# Patient Record
Sex: Male | Born: 1970 | Hispanic: No | Marital: Single | Smoking: Former smoker
Health system: Southern US, Community
[De-identification: ages and names within clinical notes are randomized; demographics above are authoritative.]

## PROBLEM LIST (undated history)

## (undated) DIAGNOSIS — E785 Hyperlipidemia, unspecified: Secondary | ICD-10-CM

## (undated) DIAGNOSIS — K449 Diaphragmatic hernia without obstruction or gangrene: Secondary | ICD-10-CM

## (undated) DIAGNOSIS — K224 Dyskinesia of esophagus: Secondary | ICD-10-CM

## (undated) DIAGNOSIS — J45909 Unspecified asthma, uncomplicated: Secondary | ICD-10-CM

## (undated) HISTORY — DX: Unspecified asthma, uncomplicated: J45.909

## (undated) HISTORY — PX: HEMORRHOID SURGERY: SHX153

## (undated) HISTORY — PX: HERNIA REPAIR: SHX51

---

## 2010-04-03 ENCOUNTER — Encounter: Payer: Self-pay | Admitting: Nurse Practitioner

## 2010-10-07 ENCOUNTER — Encounter: Payer: Self-pay | Admitting: Nurse Practitioner

## 2010-10-08 ENCOUNTER — Telehealth (INDEPENDENT_AMBULATORY_CARE_PROVIDER_SITE_OTHER): Payer: Self-pay

## 2010-10-10 ENCOUNTER — Ambulatory Visit: Payer: Self-pay | Admitting: Internal Medicine

## 2010-10-10 DIAGNOSIS — K648 Other hemorrhoids: Secondary | ICD-10-CM | POA: Insufficient documentation

## 2010-10-10 DIAGNOSIS — K625 Hemorrhage of anus and rectum: Secondary | ICD-10-CM

## 2010-10-17 ENCOUNTER — Encounter: Admission: RE | Admit: 2010-10-17 | Discharge: 2010-10-17 | Payer: Self-pay | Admitting: Internal Medicine

## 2010-11-04 ENCOUNTER — Ambulatory Visit: Payer: Self-pay | Admitting: Internal Medicine

## 2011-01-14 NOTE — Letter (Signed)
Summary: [facility] Instructions  [facility]  [address] [place], [place] [address]   Phone: [phone]  Fax: [phone]       Gavin Henry    01/18/1971    MRN: [id number]        Procedure Day /Date: 11-04-10     Arrival Time: 3:00 PM      Procedure Time :4:00 PM     Location of Procedure:                    X     [facility] (4th Floor)                         PREPARATION FOR COLONOSCOPY WITH MOVIPREP   Starting 5 days prior to your procedure 10-30-10 do not eat nuts, seeds, popcorn, corn, beans, peas,  salads, or any raw vegetables.  Do not take any fiber supplements (e.g. Metamucil, Citrucel, and Benefiber).  THE DAY BEFORE YOUR PROCEDURE         DATE: 11-03-10  DAY: Sunday  1.  Drink clear liquids the entire day-NO SOLID FOOD  2.  Do not drink anything colored red or purple.  Avoid juices with pulp.  No orange juice.  3.  Drink at least 64 oz. (8 glasses) of fluid/clear liquids during the day to prevent dehydration and help the prep work efficiently.  CLEAR LIQUIDS INCLUDE: Water Jello Ice Popsicles Tea (sugar ok, no milk/cream) Powdered fruit flavored drinks Coffee (sugar ok, no milk/cream) Gatorade Juice: apple, white grape, white cranberry  Lemonade Clear bullion, consomm, broth Carbonated beverages (any kind) Strained chicken noodle soup Hard Candy                             4.  In the morning, mix first dose of MoviPrep solution:    Empty 1 Pouch A and 1 Pouch B into the disposable container    Add lukewarm drinking water to the top line of the container. Mix to dissolve    Refrigerate (mixed solution should be used within 24 hrs)  5.  Begin drinking the prep at 5:00 p.m. The MoviPrep container is divided by 4 marks.   Every 15 minutes drink the solution down to the next mark (approximately 8 oz) until the full liter is complete.   6.  Follow completed prep with 16 oz of clear liquid of your choice (Nothing red  or purple).  Continue to drink clear liquids until bedtime.  7.  Before going to bed, mix second dose of MoviPrep solution:    Empty 1 Pouch A and 1 Pouch B into the disposable container    Add lukewarm drinking water to the top line of the container. Mix to dissolve    Refrigerate  THE DAY OF YOUR PROCEDURE      DATE: 11-04-10 DAY: Monday  Beginning at 11:00 AM  (5 hours before procedure):         1. Every 15 minutes, drink the solution down to the next mark (approx 8 oz) until the full liter is complete.  2. Follow completed prep with 16 oz. of clear liquid of your choice.    3. You may drink clear liquids until 2:00 PM  (2 HOURS BEFORE PROCEDURE).   MEDICATION INSTRUCTIONS  Unless otherwise instructed, you should take regular prescription medications with a small sip of water   as early  as possible the morning of your procedure.       OTHER INSTRUCTIONS  You will need a responsible adult at least 39 years of age to accompany you and drive you home.   This person must remain in the waiting room during your procedure.  Wear loose fitting clothing that is easily removed.  Leave jewelry and other valuables at home.  However, you may wish to bring a book to read or  an iPod/MP3 player to listen to music as you wait for your procedure to start.  Remove all body piercing jewelry and leave at home.  Total time from sign-in until discharge is approximately 2-3 hours.  You should go home directly after your procedure and rest.  You can resume normal activities the  day after your procedure.  The day of your procedure you should not:   Drive   Make legal decisions   Operate machinery   Drink alcohol   Return to work  You will receive specific instructions about eating, activities and medications before you leave.    The above instructions have been reviewed and explained to me by   _______________________    I fully understand and can verbalize these  instructions _____________________________ Date _________

## 2011-01-14 NOTE — Progress Notes (Signed)
Summary: triage  Phone Note From Other Clinic Call back at [phone]   Caller: Darl Pikes, scheduler Call For: Dr. Russella Dar  (doctor of the day) Reason for Call: Schedule Patient Appt Summary of Call: Dr. Perrin Maltese would like pt seen asap for "large volume rectal bleeding" Initial call taken by: Vallarie Mare,  October 08, 2010 3:39 PM  Follow-up for Phone Call        On and off rectal bleeding.  "large volume".  Patient  was seen in their office yesterday.   Patient  will come in and see Willette Cluster RNP on 10/10/10 2:00 Follow-up by: Darcey Nora RN, CGRN,  October 08, 2010 3:51 PM

## 2011-01-14 NOTE — Assessment & Plan Note (Signed)
Summary: rectal bleeding/Gavin Henry   History of Present Illness Visit Type: Initial Consult Primary GI MD: Stan Head MD [facility] Primary Provider: Robert Bellow, MD Requesting Provider: Robert Bellow, MD Chief Complaint: Intermittant episodes of 3-5 days of a tbsp of rectal bleeding that is BRB. Pt denies any change in bowels. Pt states that this he has had bleeding off and on since he was 40 years old.  History of Present Illness:   Patient is a 40 year old male here for evaluation of intermittent painless rectal bleeding. He has had intermittent rectal bleeding for almost 20 years and had a hemorrhoidectomy at one time. No significant constipation. Bleeding has occured with firm stools as well as soft stools. Patient has no other GI complaints. Specifically, no abdominal  or rectal pain, diarrhea, significant constipation or GERD.   GI Review of Systems      Denies abdominal pain, acid reflux, belching, bloating, chest pain, dysphagia with liquids, dysphagia with solids, heartburn, loss of appetite, nausea, vomiting, vomiting blood, weight loss, and  weight gain.      Reports rectal bleeding.     Denies anal fissure, black tarry stools, change in bowel habit, constipation, diarrhea, diverticulosis, fecal incontinence, heme positive stool, hemorrhoids, irritable bowel syndrome, jaundice, light color stool, liver problems, and  rectal pain. Preventive Screening-Counseling & Management  Alcohol-Tobacco     Smoking Status: current      Drug Use:  no.      Current Medications (verified): 1)  Advair Hfa 45-21 Mcg/act Aero (Fluticasone-Salmeterol) .Gavin Henry.. 1-2 Times A Day 2)  Singulair 10 Mg Tabs (Montelukast Sodium) .... One Tablet By Mouth As Needed 3)  Claritin 10 Mg Tabs (Loratadine) .... Two Tablet By Mouth Once Daily 4)  Alprazolam 1 Mg Tabs (Alprazolam) .... One Tablet By Mouth As Needed 5)  Albuterol Sulfate (2.5 Mg/67ml) 0.083% Nebu (Albuterol Sulfate) .... As Needed 6)  Advil 200 Mg Tabs  (Ibuprofen) .... As Needed  Allergies (verified): No Known Drug Allergies  Past History:  Past Medical History: Asthma Hepatitis?  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  The patient denies anemia, anxiety-new, arthritis/joint pain, back pain, blood in urine, breast changes/lumps, change in vision, confusion, cough, coughing up blood, depression-new, fainting, fatigue, fever, headaches-new, hearing problems, heart murmur, heart rhythm changes, itching, menstrual pain, muscle pains/cramps, night sweats, nosebleeds, pregnancy symptoms, shortness of breath, skin rash, sleeping problems, sore throat, swelling of feet/legs, swollen lymph glands, thirst - excessive , urination - excessive , urination changes/pain, urine leakage, vision changes, and voice change.    Vital Signs:  Patient profile:   40 year old male Height:      69 inches Weight:      232 pounds BMI:     34.38 Pulse rate:   88 / minute Pulse rhythm:   regular BP sitting:   108 / 72  (left arm) Cuff size:   regular  Vitals Entered By: Christie Nottingham CMA Duncan Dull) (October 10, 2010 2:05 PM)  Physical Exam  General:  Well developed, well nourished, no acute distress. Head:  Normocephalic  and atraumatic. Eyes:  Conjunctiva pink, no icterus.  Mouth:  No oral lesions. Tongue moist.  Neck:  no obvious masses  Lungs:  Clear throughout to auscultation. Heart:  Regular rate and rhythm; no murmurs, rubs,  or bruits. Abdomen:  Abdomen soft, nontender,  nondistended. No obvious masses or hepatomegaly.Normal bowel sounds.  Rectal:  No external or internal lesions. Small, mildly inflamed internal hemorrhoids on anoscopy. Msk:  Symmetrical with no gross deformities. Normal posture. Extremities:  No palmar erythema, no edema.  Neurologic:  Alert and  oriented x4;  grossly normal neurologically. Skin:  Intact without significant lesions or rashes. Cervical Nodes:  No significant cervical adenopathy. Psych:  Alert and cooperative. Normal mood and affect.   Impression & Recommendations:  Problem # 1:  RECTAL BLEEDING (ICD-569.3) Assessment Unchanged Chronic (years), intermittent painless rectal bleeding with history of hemorrhoids. Suspect bleeding secondary to  hemorrhoids as patient has had no bowel changes, no weight loss or other worrisome symptoms. To exclude neoplasm of other etiologies of rectal bleeding the the patient will be scheduled for a colonoscopy with biopsies/polypectomy (if indicated).  The risks and benefits of the procedure, as well as alternatives were discussed with the patient and he agrees to proceed. In the meantime, patient understands that constipation in the form of hard stools / straining could result in recurrent bleeding.   Orders: Colonoscopy (Colon)  Problem # 2:  HEMORRHOIDS-INTERNAL (ICD-455.0) Trial of steroid suppositories. Avoid constipation. Orders: Colonoscopy (Colon)  Patient Instructions: 1)  I sent prescriptions for Suppositories and the prep for the Colonoscopy to [facility] [address] 2)  Avoid constipation and straining. 3)  Use Miralax powder 1 cap full in a glass of water daily as needed for constipation. 4)  We scheduled the Colonoscopy on 11-04-10. 5)  Directions and brochure provided. 6)  [facility] Patient Information Guide given to patient. 7)  Copy sent to : Gavin Bellow, Md 8)  The medication list was reviewed and reconciled.  All changed / newly prescribed  medications were explained.  A complete medication list was provided to the patient / caregiver. Prescriptions: MOVIPREP 100 GM  SOLR (PEG-KCL-NACL-NASULF-NA ASC-C) As per prep instructions.  #1 x 0   Entered by:   Lowry Ram NCMA   Authorized by:   Willette Cluster NP   Signed by:   Lowry Ram NCMA on 10/10/2010   Method used:   [facility]        [facility]  [address] [phone][facility] (retail)       [address]       [place], [place]  [id number]       Ph: [phone] or [phone]       Fax: [phone]   RxID:   [id number] ANUSOL-HC 25 MG SUPP (HYDROCORTISONE ACETATE) Use supp 1 at bedrtime x 7 nights  #10 x 0   Entered by:   Lowry Ram NCMA   Authorized by:   Willette Cluster NP   Signed by:   Lowry Ram NCMA on 10/10/2010   Method used:   [facility]        [facility]  [address] [phone][facility] (retail)       [address]       [place], [place]  [id number]       Ph: [phone] or [phone]       Fax: [phone]   RxID:  [id number]  

## 2011-01-14 NOTE — Procedures (Signed)
Summary: Colonoscopy  Patient: Gavin Henry Note: All result statuses are Final unless otherwise noted.  Tests: (1) Colonoscopy (COL)   COL Colonoscopy           DONE     [facility]     [address]     [place], [place]  [address]           COLONOSCOPY PROCEDURE REPORT           PATIENT:  Gavin Henry, Gavin Henry  MR#:  [id number]     BIRTHDATE:  06/21/1971, 39 yrs. old  GENDER:  male     ENDOSCOPIST:  Iva Boop, MD, [facility]     REF. BY:  Willette Cluster, ACNP-BC     Robert Bellow, M.D.     PROCEDURE DATE:  11/04/2010     PROCEDURE:  Colonoscopy 19[address]     ASA CLASS:  Class II     INDICATIONS:  rectal bleeding     MEDICATIONS:   Fentanyl 100 mcg IV, Versed 9 mg IV           DESCRIPTION OF PROCEDURE:   After the risks benefits and     alternatives of the procedure were thoroughly explained, informed     consent was obtained.  Digital rectal exam was performed and     revealed no abnormalities and normal prostate.   The LB CF-[id number]     [id number] endoscope was introduced through the anus and advanced to     the cecum, which was identified by both the appendix and ileocecal     valve, without limitations.  The quality of the prep was     excellent, using MoviPrep.  The instrument was then slowly     withdrawn as the colon was fully examined. Insertion 1: 39 minutes     Withdrawal; 6:09 minutes     <<PROCEDUREIMAGES>>           FINDINGS:  A normal appearing cecum, ileocecal valve, and     appendiceal orifice were identified. The ascending, hepatic     flexure, transverse, splenic flexure, descending, sigmoid colon,     and rectum appeared unremarkable.   Retroflexed views in the     rectum revealed internal hemorrhoids.    The scope was then     withdrawn from the patient and the procedure completed.           COMPLICATIONS:  None     ENDOSCOPIC IMPRESSION:     1) Normal colon     2) Internal hemorrhoids     3) Excellent prep     RECOMMENDATIONS:     Routine hemorrhoid care.     If recurrent problems can be considered for banding or other     intervention.     REPEAT EXAM:  In for Colonoscopy. age 32 May 2022           Iva Boop, MD, [facility]           CC:  Robert Bellow, MD     The Patient           n.     Rosalie Doctor:   Iva Boop at 11/04/2010 05:31 PM           Zahradnik, Casimiro Needle, [phone]  Note: An exclamation mark (!) indicates a result that was not dispersed into the flowsheet. Document Creation Date: 11/04/2010 5:32 PM _______________________________________________________________________  (1) Order result status: Final Collection or observation date-time: 11/04/2010 17:26 Requested  date-time:  Receipt date-time:  Reported date-time:  Referring Physician:   Ordering Physician: Stan Head [phone]) Specimen Source:  Source: Launa Grill Order Number: [id number] Lab site:   Appended Document: Colonoscopy    Clinical Lists Changes

## 2011-01-14 NOTE — Letter (Signed)
Summary: [facility]  [facility]   Imported By: Sherian Rein 10/24/2010 10:15:33  _____________________________________________________________________  External Attachment:    Type:   Image     Comment:   External Document

## 2012-04-08 ENCOUNTER — Other Ambulatory Visit: Payer: Self-pay | Admitting: Internal Medicine

## 2012-05-07 ENCOUNTER — Ambulatory Visit (INDEPENDENT_AMBULATORY_CARE_PROVIDER_SITE_OTHER): Payer: PRIVATE HEALTH INSURANCE | Admitting: Family Medicine

## 2012-05-07 VITALS — BP 116/75 | HR 64 | Temp 97.6°F | Resp 18 | Ht 70.0 in | Wt 240.0 lb

## 2012-05-07 DIAGNOSIS — J329 Chronic sinusitis, unspecified: Secondary | ICD-10-CM

## 2012-05-07 DIAGNOSIS — R05 Cough: Secondary | ICD-10-CM

## 2012-05-07 DIAGNOSIS — M542 Cervicalgia: Secondary | ICD-10-CM

## 2012-05-07 DIAGNOSIS — R059 Cough, unspecified: Secondary | ICD-10-CM

## 2012-05-07 MED ORDER — METHOCARBAMOL 750 MG PO TABS: ORAL_TABLET | ORAL | Status: DC

## 2012-05-07 MED ORDER — BENZONATATE 100 MG PO CAPS: ORAL_CAPSULE | ORAL | Status: AC

## 2012-05-07 MED ORDER — AZITHROMYCIN 250 MG PO TABS: ORAL_TABLET | ORAL | Status: AC

## 2012-05-07 NOTE — Patient Instructions (Signed)
Heat or ice to neck as tolerated for relief  OTC ibuprofen 600-800 mg 3 times daily for pain and inflammation.

## 2012-05-07 NOTE — Progress Notes (Signed)
Subjective: One-month history of upper respiratory congestion that went into a cough. He still has some sinus discomfort. It is just lingered on he's not been wheezing now. He has a history of asthma. He works Interior and spatial designer.  Objective: No acute distress. His TMs are normal. Maxillary sinuses seem a little tender. Throat clear. Neck supple without nodes. Chest clear to auscultation. Heart regular without murmurs. He does have a little cough.  Assessment: Cough Sinusitis History of asthma  Plan: This is gone long enough that we will going to give him a round of antibiotics to see if we can break the cycle of things. He is to drink lots of fluids to try and keep his secretions thinned.

## 2012-07-18 ENCOUNTER — Other Ambulatory Visit: Payer: Self-pay | Admitting: Internal Medicine

## 2012-08-13 ENCOUNTER — Other Ambulatory Visit: Payer: Self-pay | Admitting: Physician Assistant

## 2012-11-10 ENCOUNTER — Ambulatory Visit (INDEPENDENT_AMBULATORY_CARE_PROVIDER_SITE_OTHER): Payer: PRIVATE HEALTH INSURANCE | Admitting: Physician Assistant

## 2012-11-10 VITALS — BP 122/78 | HR 88 | Temp 98.3°F | Resp 18 | Ht 69.5 in | Wt 255.2 lb

## 2012-11-10 DIAGNOSIS — J3489 Other specified disorders of nose and nasal sinuses: Secondary | ICD-10-CM

## 2012-11-10 DIAGNOSIS — J329 Chronic sinusitis, unspecified: Secondary | ICD-10-CM

## 2012-11-10 DIAGNOSIS — E669 Obesity, unspecified: Secondary | ICD-10-CM

## 2012-11-10 DIAGNOSIS — G4700 Insomnia, unspecified: Secondary | ICD-10-CM

## 2012-11-10 DIAGNOSIS — J029 Acute pharyngitis, unspecified: Secondary | ICD-10-CM

## 2012-11-10 DIAGNOSIS — J45909 Unspecified asthma, uncomplicated: Secondary | ICD-10-CM

## 2012-11-10 DIAGNOSIS — R05 Cough: Secondary | ICD-10-CM

## 2012-11-10 DIAGNOSIS — Z23 Encounter for immunization: Secondary | ICD-10-CM

## 2012-11-10 DIAGNOSIS — R0982 Postnasal drip: Secondary | ICD-10-CM

## 2012-11-10 DIAGNOSIS — R0981 Nasal congestion: Secondary | ICD-10-CM

## 2012-11-10 MED ORDER — MOMETASONE FURO-FORMOTEROL FUM 200-5 MCG/ACT IN AERO: 2.0000 | INHALATION_SPRAY | Freq: Every day | RESPIRATORY_TRACT | Status: DC

## 2012-11-10 MED ORDER — ALPRAZOLAM 1 MG PO TABS: 1.0000 mg | ORAL_TABLET | Freq: Every evening | ORAL | Status: DC | PRN

## 2012-11-10 MED ORDER — ALBUTEROL SULFATE HFA 108 (90 BASE) MCG/ACT IN AERS: 2.0000 | INHALATION_SPRAY | RESPIRATORY_TRACT | Status: DC | PRN

## 2012-11-10 NOTE — Patient Instructions (Addendum)
Begin using the Qvar inhaled steroid instead of Advair.  Let us know how this is working for you.  We can always make changes if we need to.  Continue using your allergy medication for your current symptoms.  Let us know if you feel like you are not improving, we can send in a Z-Pak for you.    Continue making positive changes to your diet.  You've done an excellent job of reducing your soda/sugar intake!  Keep up the good work!  Keeping a food journal can be very helpful in keeping track of the calories that you are actually consuming.  Oftentimes we underestimate the amount that we taking in.  Eat plenty of vegetables and fruits and lean meat.  Work on incorporating high protein snacks into your day to reduce hunger.  Almonds, walnuts, cheese, greek yogurt are good examples.  Eating 5-6 small meals a day can also be helpful in reducing hunger, rather than eating three large meals.  Continue drinking plenty of water.  Begin incorporating exercise into your day!  Even if it is just walking!  Let me know if you would like to pursue a nutrition consult.   Serving Sizes What we call a serving size today is larger than it was in the past. A 1950s fast-food burger contained little more than 1 oz of meat, and a soft drink was 8 oz (1 cup). Today, a "quarter pounder" burger is at least 4 times that amount, and a 32 or 64 oz drink is not uncommon. A possible guide for eating when trying to lose weight is to eat about half as much as you normally do. Some estimates of serving sizes are:  1 Dairy serving:Individual container of yogurt (8 oz) or piece of cheese the size of your thumb (1 oz).  1 Grain serving: 1 slice of bread or  cup pasta.  1 Meat serving: The size of a deck of cards (3 oz).  1 Fruit serving: cup canned fruit or 1 medium fruit.  1 Vegetable serving:  cup of cooked or canned vegetables.  1 Fat serving:The size of 4 stacked dimes. Experts suggest spending 1 or 2 days measuring food  portions you commonly eat. This will give you better practice at estimating serving sizes, and will also show whether you are eating an appropriate amount of food to meet your weight goals. If you find that you are eating more than you thought, try measuring your food for a few days so you can "reprogram" yourself to learn what makes a healthy portion for you. SUGGESTIONS FOR CONTROL  In restaurants, share entrees, or ask the waiter to put half the entre in a box or bag before you even touch it.  Order lunch-sized portions. Many restaurants serve 4 to 6 oz of meat at lunch, compared with 8 to 10 oz at dinner.  Split dessert or skip it all together. Have a piece of fruit when you get home.  At home, use smaller plates and bowls. It will look as if you are eating more.  Plate your food in the kitchen rather than serving it "family style" at the table.  Wait 20 to 30 minutes before taking seconds. This is how long it takes your brain to recognize that you are full.  Check food labels for serving sizes. Eat 1 serving only.  Use measuring cups and spoons to see proper serving sizes.  Buy smaller packages of candy, popcorn, and snacks.  Avoid eating directly out  of the bag or carton.  While eating half as much, exercise twice as much. Park further away from the mall, take the stairs instead of the escalator, and walk around your block. Losing weight is a slow, difficult process. It takes long-lasting lifestyle changes. You can make gradual changes over time so they become habits. Look to friends and family to support the healthy changes you are making. Avoid fad diets since they are often only temporary weight loss solutions. Document Released: 08/30/2003 Document Revised: 02/23/2012 Document Reviewed: 10/09/2009 [employer] Patient Information 2013 [place], [place].

## 2012-11-10 NOTE — Progress Notes (Signed)
Subjective:    Patient ID: Gavin Henry, male    DOB: 1971/02/28, 41 y.o.   MRN: [id number]  HPI   Gavin Henry is a 40 yr old male here with several complaints/requests.  He usually sees Dr. Perrin Henry.  (1)  Requests a flu shot  (2)  Asthma has been well controlled on [employer] for many years, but he recently received a letter from his insurance company stating that they would no longer cover this medication.  Has been out of Gavin Henry for a week or so and has noticed he has to use is [employer] more frequently.  Using this about once daily, previously only used a couple times a month.  Ins company suggested Gavin [employer].  Gavin Henry would like to make a change to something that will be affordable with insurance plan.  (3)  Request RF of Gavin [employer] that is normally prescribed by Dr. Perrin Henry.  Uses this for sleep occasionally.  He states he uses this a couple times a month.  He still has some left but worries it may be out of date and would like a new prescription.  Has used this for a couple years and tolerates it well.  (4)  Has been experiencing some chest congestion, cough, sore throat, and nasal congestion for the last couple days.  Cough is non-productive.  Rhinorrhea is clear.  No fever or chills.  Has been using allergy medication for symptoms.  Has a history of bronchitis, worries that his symptoms may worsen over the holidays.  Would like a prescription for [employer] to be standing at the pharmacy in case he needs it.  (5)  Concerned about weight gain.  States he has probably gained about 100lbs in the last 5 yrs.  Has been trying to watch what he eats and eat smaller portions.  States he often feels hungry soon after eating a large meal.  He has already made some changes to his diet, namely greatly reducing the number of sodas he is drinking.  Previously drank about 8 per day, is now drinking maybe 2-3 per week.  Trying to cook more at home, but not sure if the meals he cooks at home are very healthy.  Tries  to stay away from other sugar-sweetened beverages.  Tries to primarily drink water.  He previously was eating out about 5 days a week, now maybe 3 times per week.  States he does feel like he stress eats.  He works at [employer] and works long hours.  He recently purchased the campground from the owners and is worried about money, being able to pay the bills.  Feels like this contributes to his eating.  He does not exercise.  Previously walked probably 2 miles a day at work.  Now doing more administrative things, not walking much at all.  Does not exercise outside of work.  Has thought about joining a gym, but roommate is not interested in this.  He does not think he would like going alone.  Would like to know if there are any medications we can prescribe that would help him lose weight.       Review of Systems  Constitutional: Negative for fever and chills.  HENT: Positive for congestion, sore throat, rhinorrhea, postnasal drip and sinus pressure. Negative for sneezing.   Respiratory: Positive for cough, shortness of breath and wheezing.   Cardiovascular: Negative.   Gastrointestinal: Negative.   Musculoskeletal: Negative.   Skin: Negative.   Neurological: Negative.  Objective:   Physical Exam  Vitals reviewed. Constitutional: He is oriented to person, place, and time. He appears well-developed and well-nourished. No distress.  HENT:  Head: Normocephalic and atraumatic.  Right Ear: Tympanic membrane and ear canal normal.  Left Ear: Tympanic membrane and ear canal normal.  Nose: Nose normal. Right sinus exhibits no maxillary sinus tenderness and no frontal sinus tenderness. Left sinus exhibits no maxillary sinus tenderness and no frontal sinus tenderness.  Mouth/Throat: Uvula is midline and mucous membranes are normal. Posterior oropharyngeal erythema present. No oropharyngeal exudate or posterior oropharyngeal edema.  Neck: Neck supple.  Cardiovascular: Normal rate, regular  rhythm and normal heart sounds.  Exam reveals no gallop and no friction rub.   No murmur heard. Pulmonary/Chest: Effort normal. He has decreased breath sounds in the right lower field and the left lower field. He has no wheezes. He has no rales.  Lymphadenopathy:    He has no cervical adenopathy.  Neurological: He is alert and oriented to person, place, and time.  Skin: Skin is warm and dry.  Psychiatric: He has a normal mood and affect. His behavior is normal.      Filed Vitals:   11/10/12 1328  BP: 122/78  Pulse: 88  Temp: 98.3 F (36.8 C)  Resp: 18         Assessment & Plan:   1. Asthma  [employer] (VENTOLIN HFA) 108 (90 BASE) MCG/ACT inhaler, mometasone-formoterol (DULERA) 200-5 MCG/ACT AERO  2. Insomnia  [employer] (XANAX) 1 MG tablet  3. Need for influenza vaccination  Flu vaccine greater than or equal to 3yo preservative free IM  4. Cough    5. Sore throat    6. Nasal congestion    7. Post-nasal drainage    8. Obesity (BMI 35.0-39.9 without comorbidity)      Gavin Henry is a very pleasant 41 yr old male who is a patient of Dr. Perrin Henry.  (1)  Asthma - previously well controlled on [employer].  Has been out of this for a week or so and is needing Gavin [employer] with increased frequency.  Insurance company has stated that they will no longer cover Gavin [employer].  Will try him on [facility], which should be covered under his plan.  I have also refilled his [employer].  He will call us in the next 1-2 weeks to let us know how the El[facility] Sleight is working.    (2)  Insomnia - Xanax #30 no RF  (3)  Need for flu vaccine - given  (4-7)  URI - Suspect viral etiology.  Afebrile, lungs are CTA.  Gavin Henry will continue using antihistamines.  Offered Atrovent nasal spray for relief of congestion but [employer] declined.  Gavin Henry would like a prescription for azithro to be on file at the pharmacy should he need it some time in the next several months.  I told him that I am not comfortable doing that but that if in the next week or so  his symptoms are worsening, he may call and we can send in a Z-Pak.  He is in agreement with this.  Likewise, if his cough worsens, he may call for a cough syrup.  (8)  Obesity - Discussed this at length with the patient.  He is interested in a weight loss medication.  I discussed with him that the weight loss with those medications tends to be very small and that there are serious risks associated.  He voiced understanding of this.  We discussed diet and exercise at length.  I offered a nutrition consult but the patient declined.  We discussed incorporating high-protein snacks into his diet to reduce hunger soon after meals.  Discussed portion size.  Discussed incorporating exercise daily.  Encouraged him to continue making good dietary choices.  Reiterated that diet and exercise are the keys to weight loss.    Spent 25 mintues face to face with the patient.

## 2012-11-15 ENCOUNTER — Telehealth: Payer: Self-pay

## 2012-11-15 NOTE — Telephone Encounter (Signed)
Please advise 

## 2012-11-15 NOTE — Telephone Encounter (Signed)
Pt called and is not doing well and would like the previously discussed z-pack sent into [facility] on [address].  Best number for patient, [phone]

## 2012-11-16 MED ORDER — AZITHROMYCIN 250 MG PO TABS: ORAL_TABLET | ORAL | Status: DC

## 2012-11-16 NOTE — Telephone Encounter (Signed)
Sent to pharmacy 

## 2012-11-16 NOTE — Telephone Encounter (Signed)
Patient notified and voiced understanding.

## 2013-02-11 ENCOUNTER — Emergency Department (HOSPITAL_COMMUNITY)
Admission: EM | Admit: 2013-02-11 | Discharge: 2013-02-11 | Disposition: A | Discharge status: Home / Self Care | Payer: PRIVATE HEALTH INSURANCE | Source: Home / Self Care | Attending: Family Medicine | Admitting: Family Medicine

## 2013-02-11 ENCOUNTER — Encounter (HOSPITAL_COMMUNITY): Payer: Self-pay

## 2013-02-11 MED ORDER — AZITHROMYCIN 250 MG PO TABS: ORAL_TABLET | ORAL | Status: DC

## 2013-02-11 NOTE — ED Notes (Signed)
C/o URI type problems for 1 week, getting worse, secretions starting to turn green, history of asthma that usually turns to bronchitis

## 2013-02-11 NOTE — ED Provider Notes (Signed)
History     CSN: [id number]  Arrival date & time 02/11/13  1502   First MD Initiated Contact with Patient 02/11/13 1712      Chief Complaint  Patient presents with  . URI    (Consider location/radiation/quality/duration/timing/severity/associated sxs/prior treatment) HPI Comments: Pt reports getting bronchitis every fall and spring and needing a zpack.  Decided to seek care earlier in this illness instead of later like previous "to catch it earlier".  Patient is a 42 y.o. male presenting with URI. The history is provided by the patient.  URI Presenting symptoms: congestion, cough and rhinorrhea   Presenting symptoms: no ear pain, no fever and no sore throat   Severity:  Moderate Onset quality:  Gradual Duration:  3 days Timing:  Constant Progression:  Unchanged Chronicity:  New Relieved by:  Nothing Ineffective treatments:  OTC medications Associated symptoms: sinus pain and swollen glands   Associated symptoms: no wheezing     Past Medical History  Diagnosis Date  . Asthma     Past Surgical History  Procedure Laterality Date  . Hernia repair      Family History  Problem Relation Age of Onset  . Cancer Mother   . Diabetes Father   . Kidney disease Father   . Cancer Maternal Aunt   . Asthma Paternal Aunt   . Stroke Maternal Grandfather   . Cancer Maternal Grandfather     History  Substance Use Topics  . Smoking status: Former Smoker    Types: Cigarettes  . Smokeless tobacco: Not on file     Comment: quit April 2012  . Alcohol Use: 0.0 oz/week     Comment: social      Review of Systems  Constitutional: Negative for fever and chills.  HENT: Positive for congestion, rhinorrhea, postnasal drip and sinus pressure. Negative for ear pain and sore throat.   Respiratory: Positive for cough. Negative for shortness of breath and wheezing.     Allergies  Review of patient's allergies indicates no known allergies.  Home Medications   Current Outpatient  Rx  Name  Route  Sig  Dispense  Refill  . albuterol (VENTOLIN HFA) 108 (90 BASE) MCG/ACT inhaler   Inhalation   Inhale 2 puffs into the lungs every 2 (two) hours as needed for wheezing.   18 g   3   . mometasone-formoterol (DULERA) 200-5 MCG/ACT AERO   Inhalation   Inhale 2 puffs into the lungs daily.   13 g   2   . ALPRAZolam (XANAX) 1 MG tablet   Oral   Take 1 tablet (1 mg total) by mouth at bedtime as needed.   30 tablet   0   . azithromycin (ZITHROMAX Z-PAK) 250 MG tablet      Take 2 tabs po on day one, then 1 tab po qd for 4 days   6 each   0   . cetirizine (ZYRTEC) 10 MG tablet   Oral   Take 10 mg by mouth daily.         Marland Kitchen loratadine (CLARITIN) 10 MG tablet   Oral   Take 10 mg by mouth daily.         . methocarbamol (ROBAXIN-750) 750 MG tablet      One 3 times daily and 2 at bedtime for neck strain   40 tablet   1     BP 132/84  Pulse 79  Temp(Src) 98.4 F (36.9 C) (Oral)  Resp 16  SpO2 99%  Physical Exam  Constitutional: He appears well-developed and well-nourished. No distress.  HENT:  Right Ear: Tympanic membrane, external ear and ear canal normal.  Left Ear: Tympanic membrane, external ear and ear canal normal.  Nose: Mucosal edema and rhinorrhea present. Right sinus exhibits maxillary sinus tenderness. Right sinus exhibits no frontal sinus tenderness. Left sinus exhibits maxillary sinus tenderness. Left sinus exhibits no frontal sinus tenderness.  Mouth/Throat: Oropharynx is clear and moist.  Cardiovascular: Normal rate and regular rhythm.   Pulmonary/Chest: Effort normal and breath sounds normal.    ED Course  Procedures (including critical care time)  Labs Reviewed - No data to display No results found.   1. Sinusitis       MDM  Discussed usual care of bronchitis and sinusitis with pt. Explained watchful waiting and judicious use of antibiotics. Rx antibiotics for pt as he insists he needs them every spring/fall season change,  but pt agrees to wait to get filled and only get filled if has high fever, or is sx persist or worsen beyond 10 days.         Cathlyn Parsons, NP 02/11/13 2028

## 2013-02-12 NOTE — ED Provider Notes (Signed)
Medical screening examination/treatment/procedure(s) were performed by resident physician or non-physician practitioner and as supervising physician I was immediately available for consultation/collaboration.   Shontelle Muska DOUGLAS MD.   Shaunak Kreis D Sharlette Jansma, MD 02/12/13 1115 

## 2013-02-26 ENCOUNTER — Other Ambulatory Visit: Payer: Self-pay | Admitting: Physician Assistant

## 2013-03-02 ENCOUNTER — Ambulatory Visit: Payer: PRIVATE HEALTH INSURANCE

## 2013-03-02 ENCOUNTER — Ambulatory Visit (INDEPENDENT_AMBULATORY_CARE_PROVIDER_SITE_OTHER): Payer: PRIVATE HEALTH INSURANCE | Admitting: Family Medicine

## 2013-03-02 VITALS — BP 132/80 | HR 86 | Temp 98.5°F | Resp 16 | Ht 69.5 in | Wt 252.0 lb

## 2013-03-02 DIAGNOSIS — M542 Cervicalgia: Secondary | ICD-10-CM

## 2013-03-02 MED ORDER — MELOXICAM 15 MG PO TABS: ORAL_TABLET | ORAL | Status: DC

## 2013-03-02 MED ORDER — METHOCARBAMOL 750 MG PO TABS: ORAL_TABLET | ORAL | Status: DC

## 2013-03-02 MED ORDER — KETOROLAC TROMETHAMINE 60 MG/2ML IM SOLN
60.0000 mg | Freq: Once | INTRAMUSCULAR | Status: AC
  Administered 2013-03-02: 60 mg via INTRAMUSCULAR

## 2013-03-02 NOTE — Patient Instructions (Addendum)
Nice to meet you I think its all muscle spasm. I am giving you meloxicam.  Take 1 pil daily for next week then as needed.  I am also giving you a muscle relaxant.  Take it as written.  Also do the exercise I am giving you starting tomorrow.  Come back in 5 days if not better.

## 2013-03-02 NOTE — Progress Notes (Signed)
SUBJECTIVE:  Gavin Henry is a 42 y.o. male who complains of an injury causing low back pain 2 day(s) ago. The pain is positional with bending or lifting, without radiation down the legs. Mechanism of injury: fell on ice directly on back.. Symptoms have been constant since that time. Prior history of back problems: no prior back problems. There is no numbness in the legs. Denies bowel or bladder troubles. Able to ambulate.   OBJECTIVE: Vital signs as noted above. Patient appears to be in mild to moderate pain, antalgic gait noted. Lumbosacral spine area reveals paraspinal musculature tenderness. Painful and reduced LS ROM noted. Straight leg raise is negative at 60 degrees on bilateral. DTR's, motor strength and sensation normal, including heel and toe gait.  Peripheral pulses are palpable.  Lumbar spine X-Ray: 2 view unremarkable for fracture, overview pending.   ASSESSMENT:  lumbar strain with bruising  PLAN: For acute pain, rest, intermittent application of cold packs (later, may switch to heat, but do not sleep on heating pad), analgesics and muscle relaxants are recommended. Discussed longer term treatment plan of prn NSAID's and discussed a home back care exercise program with flexion exercise routine. Proper lifting with avoidance of heavy lifting discussed.  Consider Physical Therapy if not improving. Call or return to clinic prn if these symptoms worsen or fail to improve as anticipated.

## 2013-03-03 NOTE — Progress Notes (Signed)
Note reviewed, and agree with documentation and plan.  XR report reviewed - IMPRESSION: 1. Straightening of the normal lumbar lordosis. 2. No acute abnormality.

## 2013-03-26 ENCOUNTER — Other Ambulatory Visit: Payer: Self-pay | Admitting: Physician Assistant

## 2013-07-26 ENCOUNTER — Other Ambulatory Visit: Payer: Self-pay | Admitting: Physician Assistant

## 2013-08-01 ENCOUNTER — Other Ambulatory Visit: Payer: Self-pay | Admitting: Internal Medicine

## 2013-08-03 NOTE — Telephone Encounter (Signed)
Rx signed at TL desk, needs OV for further refills

## 2013-10-24 ENCOUNTER — Ambulatory Visit (INDEPENDENT_AMBULATORY_CARE_PROVIDER_SITE_OTHER): Payer: No Typology Code available for payment source | Admitting: Emergency Medicine

## 2013-10-24 VITALS — BP 122/70 | HR 88 | Temp 98.6°F | Resp 20 | Ht 69.75 in | Wt 252.0 lb

## 2013-10-24 DIAGNOSIS — IMO0002 Reserved for concepts with insufficient information to code with codable children: Secondary | ICD-10-CM

## 2013-10-24 DIAGNOSIS — Z23 Encounter for immunization: Secondary | ICD-10-CM

## 2013-10-24 DIAGNOSIS — S4381XA Sprain of other specified parts of right shoulder girdle, initial encounter: Secondary | ICD-10-CM

## 2013-10-24 MED ORDER — TRAMADOL HCL 50 MG PO TABS: 50.0000 mg | ORAL_TABLET | Freq: Three times a day (TID) | ORAL | Status: DC | PRN

## 2013-10-24 MED ORDER — DIPHENHYDRAMINE HCL 25 MG PO CAPS
50.0000 mg | ORAL_CAPSULE | Freq: Once | ORAL | Status: AC
  Administered 2013-10-24: 50 mg via ORAL

## 2013-10-24 MED ORDER — CYCLOBENZAPRINE HCL 10 MG PO TABS: 10.0000 mg | ORAL_TABLET | Freq: Three times a day (TID) | ORAL | Status: DC | PRN

## 2013-10-24 MED ORDER — NAPROXEN SODIUM 550 MG PO TABS: 550.0000 mg | ORAL_TABLET | Freq: Two times a day (BID) | ORAL | Status: DC

## 2013-10-24 NOTE — Patient Instructions (Signed)
Thoracic Strain °You have injured the muscles or tendons that attach to the upper part of your back behind your chest. This injury is called a thoracic strain, thoracic sprain, or mid-back strain.  °CAUSES  °The cause of thoracic strain varies. A less severe injury involves pulling a muscle or tendon without tearing it. A more severe injury involves tearing (rupturing) a muscle or tendon. With less severe injuries, there may be little loss of strength. Sometimes, there are breaks (fractures) in the bones to which the muscles are attached. These fractures are rare, unless there was a direct hit (trauma) or you have weak bones due to osteoporosis or age. Longstanding strains may be caused by overuse or improper form during certain movements. Obesity can also increase your risk for back injuries. Sudden strains may occur due to injury or not warming up properly before exercise. Often, there is no obvious cause for a thoracic strain. °SYMPTOMS  °The main symptom is pain, especially with movement, such as during exercise. °DIAGNOSIS  °Your caregiver can usually tell what is wrong by taking an X-ray and doing a physical exam. °TREATMENT  °· Physical therapy may be helpful for recovery. Your caregiver can give you exercises to do or refer you to a physical therapist after your pain improves. °· After your pain improves, strengthening and conditioning programs appropriate for your sport or occupation may be helpful. °· Always warm up before physical activities or athletics. Stretching after physical activity may also help. °· Certain over-the-counter medicines may also help. Ask your caregiver if there are medicines that would help you. °If this is your first thoracic strain injury, proper care and proper healing time before starting activities should prevent long-term problems. Torn ligaments and tendons require as long to heal as broken bones. Average healing times may be only 1 week for a mild strain. For torn muscles  and tendons, healing time may be up to 6 weeks to 2 months. °HOME CARE INSTRUCTIONS  °· Apply ice to the injured area. Ice massages may also be used as directed. °· Put ice in a plastic bag. °· Place a towel between your skin and the bag. °· Leave the ice on for 15-20 minutes, 03-04 times a day, for the first 2 days. °· Only take over-the-counter or prescription medicines for pain, discomfort, or fever as directed by your caregiver. °· Keep your appointments for physical therapy if this was prescribed. °· Use wraps and back braces as instructed. °SEEK IMMEDIATE MEDICAL CARE IF:  °· You have an increase in bruising, swelling, or pain. °· Your pain has not improved with medicines. °· You develop new shortness of breath, chest pain, or fever. °· Problems seem to be getting worse rather than better. °MAKE SURE YOU:  °· Understand these instructions. °· Will watch your condition. °· Will get help right away if you are not doing well or get worse. °Document Released: 02/21/2004 Document Revised: 02/23/2012 Document Reviewed: 01/17/2011 °[employer]® Patient Information ©2014 [employer]. ° °

## 2013-10-24 NOTE — Progress Notes (Signed)
[facility] [address], [place] [address] [phone]  Date:  10/24/2013   Name:  Gavin Henry   DOB:  10/10/1971   MRN:  [id number]  PCP:  No primary provider on file.    Chief Complaint: Back Pain and Flu Vaccine   History of Present Illness:  Gavin Henry is a 42 y.o. very pleasant male patient who presents with the following:  1 week history of pain in his right posterior shoulder.  Non radiating and no neuro symptoms.  No weakness. No history of injury or overuse.  No improvement with over the counter medications or other home remedies. Denies other complaint or health concern today.   Patient Active Problem List   Diagnosis Date Noted  . HEMORRHOIDS-INTERNAL 10/10/2010  . RECTAL BLEEDING 10/10/2010    Past Medical History  Diagnosis Date  . Asthma     Past Surgical History  Procedure Laterality Date  . Hernia repair      History  Substance Use Topics  . Smoking status: Former Smoker    Types: Cigarettes  . Smokeless tobacco: Not on file     Comment: quit April 2012  . Alcohol Use: 0.0 oz/week     Comment: social    Family History  Problem Relation Age of Onset  . Cancer Mother   . Diabetes Father   . Kidney disease Father   . Cancer Maternal Aunt   . Asthma Paternal Aunt   . Stroke Maternal Grandfather   . Cancer Maternal Grandfather     No Known Allergies  Medication list has been reviewed and updated.  Current Outpatient Prescriptions on File Prior to Visit  Medication Sig Dispense Refill  . ALPRAZolam (XANAX) 1 MG tablet TAKE 1/2 TO 1 TABLET BY MOUTH AT BEDTIME  30 tablet  0  . cetirizine (ZYRTEC) 10 MG tablet Take 10 mg by mouth daily.      . DULERA 200-5 MCG/ACT AERO INHALE 2 PUFFS INTO THE LUNGS DAILY.  13 g  2  . loratadine (CLARITIN) 10 MG tablet Take 10 mg by mouth daily.      . VENTOLIN HFA 108 (90 BASE) MCG/ACT inhaler INHALE 2 PUFFS INTO THE LUNGS EVERY 2 (TWO) HOURS AS NEEDED FOR WHEEZING.  18 each  3  .  azithromycin (ZITHROMAX Z-PAK) 250 MG tablet Take 2 tabs po on day one, then 1 tab po qd for 4 days  6 each  0  . meloxicam (MOBIC) 15 MG tablet 1 tab daily for 1 week and then as needed.  20 tablet  0  . methocarbamol (ROBAXIN-750) 750 MG tablet One 3 times daily and 2 at bedtime for neck strain  40 tablet  1   No current facility-administered medications on file prior to visit.    Review of Systems:  As per HPI, otherwise negative.    Physical Examination: Filed Vitals:   10/24/13 1619  BP: 122/70  Pulse: 88  Temp: 98.6 F (37 C)  Resp: 20   Filed Vitals:   10/24/13 1619  Height: 5' 9.75" (1.772 m)  Weight: 252 lb (114.306 kg)   Body mass index is 36.4 kg/(m^2). Ideal Body Weight: Weight in (lb) to have BMI = 25: 172.6   GEN: WDWN, NAD, Non-toxic, Alert & Oriented x 3 HEENT: Atraumatic, Normocephalic.  Ears and Nose: No external deformity. EXTR: No clubbing/cyanosis/edema NEURO: Normal gait.  FULL ROM.  Neuro intact motor and sensory PSYCH: Normally interactive. Conversant. Not depressed  or anxious appearing.  Calm demeanor.  BACK:  Tender medial to scapula no trigger point  Assessment and Plan: Scapulocostal syndrome Anaprox Flexeril Heating pad Tramadol   Signed,  Phillips Odor, MD

## 2013-10-24 NOTE — Addendum Note (Signed)
Addended by: Georg Ruddle A on: 10/24/2013 05:05 PM   Modules accepted: Orders

## 2013-11-05 ENCOUNTER — Other Ambulatory Visit: Payer: Self-pay | Admitting: Physician Assistant

## 2013-11-09 ENCOUNTER — Ambulatory Visit (INDEPENDENT_AMBULATORY_CARE_PROVIDER_SITE_OTHER): Payer: No Typology Code available for payment source | Admitting: Family Medicine

## 2013-11-09 VITALS — BP 122/80 | HR 80 | Temp 99.4°F | Resp 20 | Ht 70.5 in | Wt 248.0 lb

## 2013-11-09 DIAGNOSIS — G4700 Insomnia, unspecified: Secondary | ICD-10-CM

## 2013-11-09 DIAGNOSIS — S46811A Strain of other muscles, fascia and tendons at shoulder and upper arm level, right arm, initial encounter: Secondary | ICD-10-CM

## 2013-11-09 DIAGNOSIS — M25511 Pain in right shoulder: Secondary | ICD-10-CM

## 2013-11-09 DIAGNOSIS — S46819A Strain of other muscles, fascia and tendons at shoulder and upper arm level, unspecified arm, initial encounter: Secondary | ICD-10-CM

## 2013-11-09 DIAGNOSIS — M25519 Pain in unspecified shoulder: Secondary | ICD-10-CM

## 2013-11-09 MED ORDER — ALPRAZOLAM 1 MG PO TABS: ORAL_TABLET | ORAL | Status: DC

## 2013-11-09 MED ORDER — CYCLOBENZAPRINE HCL 10 MG PO TABS: 10.0000 mg | ORAL_TABLET | Freq: Every evening | ORAL | Status: DC | PRN

## 2013-11-09 MED ORDER — TRAMADOL HCL 50 MG PO TABS: 50.0000 mg | ORAL_TABLET | Freq: Three times a day (TID) | ORAL | Status: DC | PRN

## 2013-11-09 NOTE — Progress Notes (Signed)
Chief Complaint:  Chief Complaint  Patient presents with  . Follow-up    from 10/24/13 visit  . Medication Refill    Alprazolam 1mg     HPI: Gavin Henry is a 42 y.o. male who is here for : 1. Refill on his xanax for insomnia 2. 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  Denies SI/HI   Past Medical History  Diagnosis Date  . Asthma    Past Surgical History  Procedure Laterality Date  . Hernia repair     History   Social History  . Marital Status: Single    Spouse Name: N/A    Number of Children: N/A  . Years of Education: N/A   Social History Main Topics  . Smoking status: Former Smoker    Types: Cigarettes  . Smokeless tobacco: None     Comment: quit April 2012  . Alcohol Use: 0.0 oz/week     Comment: social  . Drug Use: None  . Sexual Activity: None   Other Topics Concern  . None   Social History Narrative  . None   Family History  Problem Relation Age of Onset  . Cancer Mother   . Diabetes Father   . Kidney disease Father   . Cancer Maternal Aunt   . Asthma Paternal Aunt   . Stroke Maternal Grandfather   . Cancer Maternal Grandfather    No Known Allergies Prior to Admission medications   Medication Sig Start Date End Date Taking? Authorizing Provider  ALPRAZolam Prudy Feeler) 1 MG tablet TAKE 1/2 TO 1 TABLET BY MOUTH AT BEDTIME 08/01/13  Yes Eleanore E Egan, PA-C  cetirizine (ZYRTEC) 10 MG tablet Take 10 mg by mouth daily.   Yes Historical Provider, MD  cyclobenzaprine (FLEXERIL) 10 MG tablet Take 1 tablet (10 mg total) by mouth 3 (three) times daily as needed for muscle  spasms. 10/24/13  Yes Phillips Odor, MD  DULERA 200-5 MCG/ACT AERO INHALE 2 PUFFS INTO THE LUNGS DAILY. 02/26/13  Yes Marzella Schlein McClung, PA-C  loratadine (CLARITIN) 10 MG tablet Take 10 mg by mouth daily.   Yes Historical Provider, MD  VENTOLIN HFA 108 (90 BASE) MCG/ACT inhaler INHALE 2 PUFFS INTO THE LUNGS EVERY 2 (TWO) HOURS AS NEEDED FOR WHEEZING. 03/26/13  Yes Anders Simmonds, PA-C  meloxicam (MOBIC) 15 MG tablet 1 tab daily for 1 week and then as needed. 03/02/13   Judi Saa, DO  methocarbamol (ROBAXIN-750) 750 MG tablet One 3 times daily and 2 at bedtime for neck strain 03/02/13   Judi Saa, DO  naproxen sodium (ANAPROX DS) 550 MG tablet Take 1 tablet (550 mg total) by mouth 2 (two) times daily with a meal. 10/24/13 10/24/14  Phillips Odor, MD  traMADol (ULTRAM) 50 MG tablet Take 1 tablet (50 mg total) by mouth every 8 (eight) hours as needed. 10/24/13   Phillips Odor, MD     ROS: The patient denies fevers, chills, night sweats, unintentional weight loss, chest pain, palpitations, wheezing, dyspnea on exertion, nausea, vomiting, abdominal pain, dysuria, hematuria, melena, numbness, weakness,  or tingling.   All other systems have been reviewed and were otherwise negative with the exception of those mentioned in the HPI and as above.    PHYSICAL EXAM: Filed Vitals:   11/09/13 1440  BP: 122/80  Pulse: 80  Temp: 99.4 F (37.4 C)  Resp: 20   Filed Vitals:   11/09/13 1440  Height: 5' 10.5" (1.791 m)  Weight: 248 lb (112.492 kg)   Body mass index is 35.07 kg/(m^2).  General: Alert, no acute distress HEENT:  Normocephalic, atraumatic, oropharynx patent. EOMI, PERRLA Cardiovascular:  Regular rate and rhythm, no rubs murmurs or gallops.  No Carotid bruits, radial pulse intact. No pedal edema.  Respiratory: Clear to auscultation bilaterally.  No wheezes, rales, or rhonchi.  No cyanosis, no use of accessory musculature GI: No organomegaly, abdomen is soft and non-tender,  positive bowel sounds.  No masses. Skin: No rashes. Neurologic: Facial musculature symmetric. Psychiatric: Patient is appropriate throughout our interaction. Lymphatic: No cervical lymphadenopathy Musculoskeletal: Gait intact. Right scapularis pain/tenderness with lift off testing and palpation 5/5 strength, 2/2 DTR Neg Hawkins Neg Neers Neg empty can Full ROM   LABS: No results found for this or any previous visit.   EKG/XRAY:   Primary read interpreted by Dr. Conley Rolls at [address].   ASSESSMENT/PLAN: Encounter Diagnoses  Name Primary?  . Pain in joint, shoulder region, right Yes  . Subscapularis (muscle) sprain and strain, right, initial encounter   . Insomnia    Refilled xanax Refilled flexeril , tramadol, otc ibuprofen F/u prn  Gross sideeffects, risk and benefits, and alternatives of medications d/w patient. Patient is aware that all medications have potential sideeffects and we are unable to predict every sideeffect or drug-drug interaction that may occur.  Rockne Coons, DO 11/09/2013 4:33 PM

## 2014-01-06 ENCOUNTER — Other Ambulatory Visit: Payer: Self-pay

## 2014-01-06 MED ORDER — MOMETASONE FURO-FORMOTEROL FUM 200-5 MCG/ACT IN AERO: INHALATION_SPRAY | RESPIRATORY_TRACT | Status: DC

## 2014-01-06 NOTE — Telephone Encounter (Signed)
Dr Conley RollsLe, you saw pt in Nov, but doesn't look like Gavin SleightDulera was specifically discussed. Do you want to RF it or does pt need to RTC?

## 2014-09-11 ENCOUNTER — Encounter: Payer: Self-pay | Admitting: Family Medicine

## 2014-09-11 ENCOUNTER — Ambulatory Visit (INDEPENDENT_AMBULATORY_CARE_PROVIDER_SITE_OTHER): Payer: No Typology Code available for payment source | Admitting: Family Medicine

## 2014-09-11 VITALS — BP 108/84 | HR 100 | Temp 98.8°F | Resp 16 | Ht 69.5 in | Wt 260.2 lb

## 2014-09-11 DIAGNOSIS — M545 Low back pain, unspecified: Secondary | ICD-10-CM

## 2014-09-11 DIAGNOSIS — G4733 Obstructive sleep apnea (adult) (pediatric): Secondary | ICD-10-CM

## 2014-09-11 DIAGNOSIS — M25559 Pain in unspecified hip: Secondary | ICD-10-CM

## 2014-09-11 DIAGNOSIS — M25569 Pain in unspecified knee: Secondary | ICD-10-CM

## 2014-09-11 DIAGNOSIS — E663 Overweight: Secondary | ICD-10-CM

## 2014-09-11 DIAGNOSIS — Z9989 Dependence on other enabling machines and devices: Secondary | ICD-10-CM

## 2014-09-11 DIAGNOSIS — Z23 Encounter for immunization: Secondary | ICD-10-CM

## 2014-09-11 DIAGNOSIS — J45909 Unspecified asthma, uncomplicated: Secondary | ICD-10-CM

## 2014-09-11 DIAGNOSIS — G4700 Insomnia, unspecified: Secondary | ICD-10-CM

## 2014-09-11 MED ORDER — ALPRAZOLAM 1 MG PO TABS: ORAL_TABLET | ORAL | Status: DC

## 2014-09-11 MED ORDER — MOMETASONE FURO-FORMOTEROL FUM 200-5 MCG/ACT IN AERO: INHALATION_SPRAY | RESPIRATORY_TRACT | Status: DC

## 2014-09-11 MED ORDER — CYCLOBENZAPRINE HCL 5 MG PO TABS: ORAL_TABLET | ORAL | Status: DC

## 2014-09-11 MED ORDER — ALBUTEROL SULFATE HFA 108 (90 BASE) MCG/ACT IN AERS: 1.0000 | INHALATION_SPRAY | RESPIRATORY_TRACT | Status: DC | PRN

## 2014-09-11 NOTE — Patient Instructions (Addendum)
Exercise 150 minutes per week as discussed, watch portions, and look at nutrition labels. Walking for exercise. Recheck for physical in 6 months or follow up sooner if needed for concerns addressed today.   We will refer you for new sleep study.   If any change in asthma symptoms- return to discuss medications.  See back care manual for exercises, and ways to lessen back pain.    Return to the clinic or go to the nearest emergency room if any of your symptoms worsen or new symptoms occur.

## 2014-09-11 NOTE — Progress Notes (Addendum)
Subjective:    Patient ID: Gavin Henry, male    DOB: May 12, 1971, 43 y.o.   MRN: [id number] This chart was scribed for Shade Flood, MD by Julian Hy, ED Scribe. The patient was seen in Room 25. The patient's care was started at 2:03 PM.   09/11/2014  Chief Complaint  Patient presents with  . Medication Refill  . per pt need new c-pap machine    HPI HPI Comments: Gavin Henry is a 43 y.o. male who presents to the [facility] for medication refill.  Former primary pt of Dr. Perrin Maltese, now followed by me.   1.) Insomnia He takes Xanax 1 mg, 0.5-1, number 30 with 2 refills were given in November 2014 by Dr. Conley Rolls. Pt notes he typically takes medication twice/week. He notes his insomnia is often caused by stress at work and travel. Pt states his mind-races at night. Pt denies any new side effects of the medication.   2.) OSA Uses CPAP, requesting new device today. Last sleep study over 5 years ago. The humidifier is broken, prescribed by [facility] over 5 years ago. But told he needs a new sleep study and with his insurance this needs to be done by [facility] in [place].   3.) Asthma Takes dulera 200/5 and has albuterol if needed.  Pt reports taking 2 puffs, twice a day occassionally. He prefers to use one puff, twice per day to make the medication to last longer He denies needing to use his albuterol and dulera concurrently. Pt has approximately 0.25-0.5 canister of albuterol left. He previously used advair and notes that it worked better than [place] but his insurance no longer carries Tax [employer].   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 He has used Flexeril for this in the past. No c/o of this at current visit.   5.) Possible Kidney Stones Pt notes he previously saw Dr. Neva Seat after a fall on ice and notes he passed a crystalline structure during urination. He was concerned that he may have a kidney stone.  He notes some occasional issues with urination that he discussed with Dr. Perrin Maltese and was determined to be an UTI. Pt notes some moderate, intermittent dysuria that he describes as "stinging" that has since resolved. He denies it has occurred within the past year. Pt denies ever being diagnosed with kidney stones. Pt denies hematuria. Pt denies his symptoms are occurring at this time.  6.) Joint Pain Pt has occasional low back pain knee pain bilaterally that occurs 2-3x/year. He denies regular exercise. Pt notes he takes Aleve with some relief. Pt notes he has previously used left over flexeril. Pt notes the back often occurs after driving several hours, and then uses the Flexeril. Pt states that he feels lethargic after using Flexeril.    Exercise/Eating Habits Pt denies any regular exercise routine. Pt denies he regularly eats fruits and vegetables.   Pt drinks about 2-3 glasses of wine/week. Pt denies any thoughts of self-harm. Pt notes some rare occasional depression. Pt denies any thoughts depression now. Pt works at a [employer].   Pt does have some sensitivity to eggs. Pt notes some swelling and soreness when he receives the flu shot. Pt denies any breathing difficulties or hives on the body. He notes he has been given the flu shot previously.   Review of Systems  Genitourinary: Positive for dysuria. Negative for hematuria.  Musculoskeletal: Positive for arthralgias (knees bilaterally) and back  pain.  All other systems reviewed and are negative.  Patient Active Problem List   Diagnosis Date Noted  . HEMORRHOIDS-INTERNAL 10/10/2010  . RECTAL BLEEDING 10/10/2010   Past Medical History  Diagnosis Date  . Asthma    Past Surgical History  Procedure Laterality Date  . Hernia repair     No Known Allergies Prior to Admission medications   Medication Sig Start Date End Date Taking? Authorizing Provider  ALPRAZolam Prudy Feeler) 1 MG tablet TAKE 1/2 TO 1 TABLET BY MOUTH AT BEDTIME 11/09/13    Thao P Le, DO  cetirizine (ZYRTEC) 10 MG tablet Take 10 mg by mouth daily.    Historical Provider, MD  cyclobenzaprine (FLEXERIL) 10 MG tablet Take 1 tablet (10 mg total) by mouth at bedtime as needed for muscle spasms. 11/09/13   Thao P Le, DO  loratadine (CLARITIN) 10 MG tablet Take 10 mg by mouth daily.    Historical Provider, MD  meloxicam (MOBIC) 15 MG tablet 1 tab daily for 1 week and then as needed. 03/02/13   Judi Saa, DO  mometasone-formoterol (DULERA) 200-5 MCG/ACT AERO INHALE 2 PUFFS INTO THE LUNGS DAILY. 01/06/14   Thao P Le, DO  traMADol (ULTRAM) 50 MG tablet Take 1 tablet (50 mg total) by mouth every 8 (eight) hours as needed. 11/09/13   Thao P Le, DO  VENTOLIN HFA 108 (90 BASE) MCG/ACT inhaler INHALE 2 PUFFS INTO THE LUNGS EVERY 2 (TWO) HOURS AS NEEDED FOR WHEEZING. 03/26/13   Anders Simmonds, PA-C   History   Social History  . Marital Status: Single    Spouse Name: N/A    Number of Children: N/A  . Years of Education: N/A   Occupational History  . Not on file.   Social History Main Topics  . Smoking status: Former Smoker    Types: Cigarettes  . Smokeless tobacco: Not on file     Comment: quit April 2012  . Alcohol Use: 0.0 oz/week     Comment: social  . Drug Use: Not on file  . Sexual Activity: Not on file   Other Topics Concern  . Not on file   Social History Narrative  . No narrative on file     Objective:  Physical Exam  Nursing note and vitals reviewed. Constitutional: He is oriented to person, place, and time. He appears well-developed and well-nourished.  HENT:  Head: Normocephalic and atraumatic.  Eyes: EOM are normal. Pupils are equal, round, and reactive to light.  Neck: No JVD present. Carotid bruit is not present.  Cardiovascular: Normal rate, regular rhythm and normal heart sounds.   No murmur heard. Pulmonary/Chest: Effort normal and breath sounds normal. He has no rales.  Musculoskeletal: He exhibits no edema.  FROM of back. Does  feel some tightness with flexion of lumbar spine.  Neurological: He is alert and oriented to person, place, and time.  Skin: Skin is warm and dry.  Psychiatric: He has a normal mood and affect.   No results found for this or any previous visit.  Filed Vitals:   09/11/14 1347  BP: 108/84  Pulse: 100  Temp: 98.8 F (37.1 C)  TempSrc: Oral  Resp: 16  Height: 5' 9.5" (1.765 m)  Weight: 260 lb 3.2 oz (118.026 kg)  SpO2: 95%  Body mass index is 37.89 kg/(m^2).  Assessment & Plan:  2:21 PM- Patient informed of current plan for treatment and evaluation and agrees with plan at this time.  Gavin Henry is a  43 y.o. male Need for prophylactic vaccination and inoculation against influenza - Plan: Flu Vaccine QUAD 36+ mos IM given.   Insomnia - Plan: ALPRAZolam (XANAX) 1 MG tablet  -controlled. Episodic use. Sleep hygiene and relaxation techniques, but can also discuss further at next ov to determine other stressors and mgt of these sx's.   Low back pain without sciatica, unspecified back pain laterality - Plan: cyclobenzaprine (FLEXERIL) 5 MG tablet  -lumbago, intermittent. No red flags on exam/hx. Possible overweight contributor. Exercise and wt. Loss, HEP by back care manual and recheck if not improving. Can hold on flexeril as side effects at this point.   Chronic arthralgias of knees and hips, unspecified laterality  -possible OA. Sx care, wt loss with diet changes and increased activity. Recheck further next OV - sooner if worse.   Asthma without acute exacerbation, mild persistent - Plan: mometasone-formoterol (DULERA) 200-5 MCG/ACT AERO, albuterol (VENTOLIN HFA) 108 (90 BASE) MCG/ACT inhaler - refilled meds - overall stable.   Overweight - discussed MSk concerns above as well as other health risks.  Work initially on portion control, reading nutrition labels and increase exercise- can start with walking property instead of golf cart.   OSA on CPAP - Plan: Ambulatory referral to Sleep  Studies  -new sleep study ordered, with plan of new equipment if needed.  Continue CPAP.   recheck for CPE in 6 months.    Meds ordered this encounter  Medications  . ranitidine (ZANTAC) 150 MG tablet    Sig: Take 150 mg by mouth 2 (two) times daily.  . mometasone-formoterol (DULERA) 200-5 MCG/ACT AERO    Sig: INHALE 2 PUFFS INTO THE LUNGS DAILY.    Dispense:  39 g    Refill:  5  . ALPRAZolam (XANAX) 1 MG tablet    Sig: TAKE 1/2 TO 1 TABLET BY MOUTH AT BEDTIME    Dispense:  30 tablet    Refill:  2  . cyclobenzaprine (FLEXERIL) 5 MG tablet    Sig: 1 pill by mouth up to every 8 hours as needed. Start with one pill by mouth each bedtime as needed due to sedation    Dispense:  15 tablet    Refill:  1  . albuterol (VENTOLIN HFA) 108 (90 BASE) MCG/ACT inhaler    Sig: Inhale 1-2 puffs into the lungs every 4 (four) hours as needed for wheezing or shortness of breath.    Dispense:  1 Inhaler    Refill:  1   Patient Instructions  Exercise 150 minutes per week as discussed, watch portions, and look at nutrition labels. Walking for exercise. Recheck for physical in 6 months or follow up sooner if needed for concerns addressed today.   We will refer you for new sleep study.   If any change in asthma symptoms- return to discuss medications.  See back care manual for exercises, and ways to lessen back pain.    Return to the clinic or go to the nearest emergency room if any of your symptoms worsen or new symptoms occur.   I personally performed the services described in this documentation, which was scribed in my presence. The recorded information has been reviewed and considered, and addended by me as needed.

## 2015-06-22 ENCOUNTER — Ambulatory Visit (INDEPENDENT_AMBULATORY_CARE_PROVIDER_SITE_OTHER): Payer: 59

## 2015-06-22 ENCOUNTER — Ambulatory Visit (INDEPENDENT_AMBULATORY_CARE_PROVIDER_SITE_OTHER): Payer: 59 | Admitting: Family Medicine

## 2015-06-22 VITALS — BP 124/84 | HR 107 | Temp 97.8°F | Resp 18 | Ht 69.5 in | Wt 255.2 lb

## 2015-06-22 DIAGNOSIS — R0789 Other chest pain: Secondary | ICD-10-CM

## 2015-06-22 DIAGNOSIS — M546 Pain in thoracic spine: Secondary | ICD-10-CM

## 2015-06-22 MED ORDER — MELOXICAM 15 MG PO TABS: 15.0000 mg | ORAL_TABLET | Freq: Every day | ORAL | Status: DC

## 2015-06-22 MED ORDER — CYCLOBENZAPRINE HCL 10 MG PO TABS: 10.0000 mg | ORAL_TABLET | Freq: Three times a day (TID) | ORAL | Status: DC | PRN

## 2015-06-22 NOTE — Patient Instructions (Signed)
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
## 2015-06-24 ENCOUNTER — Encounter: Payer: Self-pay | Admitting: Physician Assistant
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
## 2015-07-11 ENCOUNTER — Telehealth: Payer: Self-pay

## 2015-07-11 DIAGNOSIS — J4530 Mild persistent asthma, uncomplicated: Secondary | ICD-10-CM

## 2015-07-11 MED ORDER — FLUTICASONE-SALMETEROL 250-50 MCG/DOSE IN AEPB: 1.0000 | INHALATION_SPRAY | Freq: Two times a day (BID) | RESPIRATORY_TRACT | Status: DC

## 2015-07-11 NOTE — Telephone Encounter (Signed)
advair ordered

## 2015-07-11 NOTE — Telephone Encounter (Signed)
Pharm faxed notice that pt's ins does not cover Dulera if preferred inhalers have not been tried. Preferred are Advair, Breo Ellipta or Symbicort. Dr Neva Seat, do you want to Rx one of these instead?

## 2015-11-28 ENCOUNTER — Ambulatory Visit (INDEPENDENT_AMBULATORY_CARE_PROVIDER_SITE_OTHER): Payer: 59 | Admitting: Physician Assistant

## 2015-11-28 VITALS — BP 128/86 | HR 93 | Temp 97.9°F | Resp 18 | Ht 69.5 in | Wt 258.6 lb

## 2015-11-28 DIAGNOSIS — J4530 Mild persistent asthma, uncomplicated: Secondary | ICD-10-CM

## 2015-11-28 DIAGNOSIS — J019 Acute sinusitis, unspecified: Secondary | ICD-10-CM

## 2015-11-28 DIAGNOSIS — J3489 Other specified disorders of nose and nasal sinuses: Secondary | ICD-10-CM | POA: Diagnosis not present

## 2015-11-28 DIAGNOSIS — J45909 Unspecified asthma, uncomplicated: Secondary | ICD-10-CM

## 2015-11-28 MED ORDER — IPRATROPIUM BROMIDE 0.03 % NA SOLN: 2.0000 | Freq: Two times a day (BID) | NASAL | Status: DC

## 2015-11-28 MED ORDER — GUAIFENESIN ER 1200 MG PO TB12: 1.0000 | ORAL_TABLET | Freq: Two times a day (BID) | ORAL | Status: DC | PRN

## 2015-11-28 MED ORDER — FLUTICASONE-SALMETEROL 250-50 MCG/DOSE IN AEPB: 1.0000 | INHALATION_SPRAY | Freq: Two times a day (BID) | RESPIRATORY_TRACT | Status: DC

## 2015-11-28 MED ORDER — AZITHROMYCIN 250 MG PO TABS: ORAL_TABLET | ORAL | Status: DC

## 2015-11-28 MED ORDER — ALBUTEROL SULFATE HFA 108 (90 BASE) MCG/ACT IN AERS
1.0000 | INHALATION_SPRAY | RESPIRATORY_TRACT | Status: AC | PRN
Start: 2015-11-28 — End: ?

## 2015-11-28 NOTE — Progress Notes (Signed)
[facility] [address], [place] [id number] [phone]  Date:  11/28/2015   Name:  Gavin HearingMichael Henry   DOB:  02/20/1971   MRN:  [id number]  PCP:  Shade FloodGREENE,JEFFREY R, MD   Chief Complaint  Patient presents with  . Sinusitis    x4 days     History of Present Illness:  Gavin Henry is a 44 y.o. male patient who [facility], that [place].   --sinus congestion.  Dizziness.  Coughing up mucus last night.  Yellowish-green tinge.  Coughing worse in the morning.  No sob or dyspnea.  C-pap reading 7.5.  No fever.  Ear discomfort.  Albuterol, Advair.  Afrin which did not help.  He has only taken this for 3 days, he reports. Patient does not know of perforation in nasal septum.  He reports that he generally will have a bump there that he is always rubbing and picking.     Patient Active Problem List   Diagnosis Date Noted  . HEMORRHOIDS-INTERNAL 10/10/2010  . RECTAL BLEEDING 10/10/2010    Past Medical History  Diagnosis Date  . Asthma     Past Surgical History  Procedure Laterality Date  . Hernia repair      Social History  Substance Use Topics  . Smoking status: Former Smoker    Types: Cigarettes  . Smokeless tobacco: None     Comment: quit April 2012  . Alcohol Use: 0.0 oz/week     Comment: social    Family History  Problem Relation Age of Onset  . Cancer Mother   . Diabetes Father   . Kidney disease Father   . Cancer Maternal Aunt   . Asthma Paternal Aunt   . Stroke Maternal Grandfather   . Cancer Maternal Grandfather     No Known Allergies  Medication list has been reviewed and updated.  Current Outpatient Prescriptions on File Prior to Visit  Medication Sig Dispense Refill  . albuterol (VENTOLIN HFA) 108 (90 BASE) MCG/ACT inhaler Inhale 1-2 puffs into the lungs every 4 (four) hours as needed for wheezing or shortness of breath. 1 Inhaler 1  . ALPRAZolam (XANAX) 1 MG tablet TAKE 1/2 TO 1 TABLET BY  MOUTH AT BEDTIME 30 tablet 2  . cetirizine (ZYRTEC) 10 MG tablet Take 10 mg by mouth daily.    . cyclobenzaprine (FLEXERIL) 10 MG tablet Take 1 tablet (10 mg total) by mouth 3 (three) times daily as needed for muscle spasms. 30 tablet 0  . Fluticasone-Salmeterol (ADVAIR DISKUS) 250-50 MCG/DOSE AEPB Inhale 1 puff into the lungs 2 (two) times daily. 1 each 3  . loratadine (CLARITIN) 10 MG tablet Take 10 mg by mouth daily.    . meloxicam (MOBIC) 15 MG tablet Take 1 tablet (15 mg total) by mouth daily. (Patient not taking: Reported on 11/28/2015) 30 tablet 1  . ranitidine (ZANTAC) 150 MG tablet Take 150 mg by mouth 2 (two) times daily. Reported on 11/28/2015    . traMADol (ULTRAM) 50 MG tablet Take 1 tablet (50 mg total) by mouth every 8 (eight) hours as needed. (Patient not taking: Reported on 06/22/2015) 30 tablet 0   No current facility-administered medications on file prior to visit.    ROS   Physical Examination: BP 128/86 mmHg  Pulse 93  Temp(Src) 97.9 F (36.6 C) (Oral)  Resp 18  Ht 5' 9.5" (1.765 m)  Wt 258 lb 9.6 oz (117.3 kg)  BMI  37.65 kg/m2  SpO2 97% Ideal Body Weight: Weight in (lb) to have BMI = 25: 171.4  Physical Exam  Constitutional: He is oriented to person, place, and time. He appears well-developed and well-nourished. No distress.  HENT:  Head: Atraumatic.  Right Ear: Tympanic membrane, external ear and ear canal normal.  Left Ear: Tympanic membrane, external ear and ear canal normal.  Nose: Mucosal edema and rhinorrhea present. Right sinus exhibits maxillary sinus tenderness. Right sinus exhibits no frontal sinus tenderness. Left sinus exhibits maxillary sinus tenderness. Left sinus exhibits no frontal sinus tenderness.  Mouth/Throat: No uvula swelling. No oropharyngeal exudate, posterior oropharyngeal edema or posterior oropharyngeal erythema.  Septum perforation with erythema with rounded borders..    Eyes: Conjunctivae, EOM and lids are normal. Pupils are equal,  round, and reactive to light. Right eye exhibits normal extraocular motion. Left eye exhibits normal extraocular motion.  Neck: Trachea normal and full passive range of motion without pain. No edema and no erythema present.  Cardiovascular: Normal rate.   Pulmonary/Chest: Effort normal. No respiratory distress. He has no decreased breath sounds. He has no wheezes. He has no rhonchi.  Neurological: He is alert and oriented to person, place, and time.  Skin: Skin is warm and dry. He is not diaphoretic.  Psychiatric: He has a normal mood and affect. His behavior is normal.     Assessment and Plan: Gavin Henry is a 44 y.o. male who is here today for chief complaint of sinus pressure.   Treating asthma, and maxillary sinu situs.  Nasal septum concerning of unknown perforation.  Must rule out more concerning etiology.     Subacute sinusitis, unspecified location - Plan: ipratropium (ATROVENT) 0.03 % nasal spray, Guaifenesin (MUCINEX MAXIMUM STRENGTH) 1200 MG TB12, azithromycin (ZITHROMAX) 250 MG tablet  Asthma without acute exacerbation - Plan: albuterol (VENTOLIN HFA) 108 (90 BASE) MCG/ACT inhaler  Asthma, mild persistent, uncomplicated - Plan: Fluticasone-Salmeterol (ADVAIR DISKUS) 250-50 MCG/DOSE AEPB  Nose septum perforation - Plan: Ambulatory referral to ENT    Trena Platt, PA-C [facility] 11/28/2015 2:25 PM

## 2015-11-28 NOTE — Patient Instructions (Signed)

## 2015-12-05 ENCOUNTER — Encounter: Payer: 59 | Admitting: Physician Assistant

## 2016-01-15 ENCOUNTER — Telehealth: Payer: Self-pay

## 2016-01-15 NOTE — Telephone Encounter (Signed)
Waiting on payment of $42.75 for 74 pages from [employer].

## 2016-01-15 NOTE — Telephone Encounter (Signed)
Payment was received and records were faxed on 01/15/16

## 2016-01-17 DIAGNOSIS — Z0271 Encounter for disability determination: Secondary | ICD-10-CM

## 2016-05-01 ENCOUNTER — Observation Stay (HOSPITAL_BASED_OUTPATIENT_CLINIC_OR_DEPARTMENT_OTHER): Payer: BLUE CROSS/BLUE SHIELD

## 2016-05-01 ENCOUNTER — Encounter (HOSPITAL_COMMUNITY): Payer: Self-pay | Admitting: Emergency Medicine

## 2016-05-01 ENCOUNTER — Observation Stay (HOSPITAL_COMMUNITY): Payer: BLUE CROSS/BLUE SHIELD

## 2016-05-01 ENCOUNTER — Emergency Department (HOSPITAL_COMMUNITY): Payer: BLUE CROSS/BLUE SHIELD

## 2016-05-01 ENCOUNTER — Observation Stay (HOSPITAL_COMMUNITY)
Admission: EM | Admit: 2016-05-01 | Discharge: 2016-05-01 | Disposition: A | Discharge status: Home / Self Care | Payer: BLUE CROSS/BLUE SHIELD | Attending: Internal Medicine | Admitting: Internal Medicine

## 2016-05-01 DIAGNOSIS — J45909 Unspecified asthma, uncomplicated: Secondary | ICD-10-CM | POA: Insufficient documentation

## 2016-05-01 DIAGNOSIS — R1310 Dysphagia, unspecified: Secondary | ICD-10-CM

## 2016-05-01 DIAGNOSIS — E785 Hyperlipidemia, unspecified: Secondary | ICD-10-CM

## 2016-05-01 DIAGNOSIS — R079 Chest pain, unspecified: Principal | ICD-10-CM | POA: Insufficient documentation

## 2016-05-01 DIAGNOSIS — K224 Dyskinesia of esophagus: Secondary | ICD-10-CM

## 2016-05-01 DIAGNOSIS — Z87891 Personal history of nicotine dependence: Secondary | ICD-10-CM | POA: Diagnosis not present

## 2016-05-01 DIAGNOSIS — K449 Diaphragmatic hernia without obstruction or gangrene: Secondary | ICD-10-CM

## 2016-05-01 HISTORY — DX: Dyskinesia of esophagus: K22.4

## 2016-05-01 HISTORY — DX: Morbid (severe) obesity due to excess calories: E66.01

## 2016-05-01 HISTORY — DX: Diaphragmatic hernia without obstruction or gangrene: K44.9

## 2016-05-01 HISTORY — DX: Hyperlipidemia, unspecified: E78.5

## 2016-05-01 LAB — NM MYOCAR MULTI W/SPECT W/WALL MOTION / EF
CHL CUP NUCLEAR SDS: 1
CHL CUP NUCLEAR SRS: 6
CHL CUP RESTING HR STRESS: 109 {beats}/min
CHL CUP STRESS STAGE 3 HR: 115 {beats}/min
CHL CUP STRESS STAGE 3 SPEED: 0 mph
CHL CUP STRESS STAGE 4 GRADE: 0 %
CHL CUP STRESS STAGE 4 SPEED: 0 mph
CSEPED: 5 min
CSEPHR: 73 %
CSEPPBP: 129 mmHg
CSEPPHR: 114 {beats}/min
Estimated workload: 1 METS
LV dias vol: 89 mL (ref 62–150)
LV sys vol: 41 mL
MPHR: 177 {beats}/min
Percent of predicted max HR: 64 %
RATE: 0
SSS: 7
Stage 1 DBP: 78 mmHg
Stage 1 Grade: 0 %
Stage 1 HR: 106 {beats}/min
Stage 1 SBP: 132 mmHg
Stage 1 Speed: 0 mph
Stage 2 Grade: 0 %
Stage 2 HR: 106 {beats}/min
Stage 2 Speed: 0 mph
Stage 3 DBP: 85 mmHg
Stage 3 Grade: 0 %
Stage 3 SBP: 125 mmHg
Stage 4 DBP: 84 mmHg
Stage 4 HR: 114 {beats}/min
Stage 4 SBP: 129 mmHg
TID: 1.22

## 2016-05-01 LAB — CBC
HCT: 47.5 % (ref 39.0–52.0)
Hemoglobin: 16.1 g/dL (ref 13.0–17.0)
MCH: 30.7 pg (ref 26.0–34.0)
MCHC: 33.9 g/dL (ref 30.0–36.0)
MCV: 90.5 fL (ref 78.0–100.0)
Platelets: 369 10*3/uL (ref 150–400)
RBC: 5.25 MIL/uL (ref 4.22–5.81)
RDW: 13.5 % (ref 11.5–15.5)
WBC: 10.1 10*3/uL (ref 4.0–10.5)

## 2016-05-01 LAB — RAPID URINE DRUG SCREEN, HOSP PERFORMED
Amphetamines: NOT DETECTED
Barbiturates: NOT DETECTED
Benzodiazepines: NOT DETECTED
Cocaine: NOT DETECTED
OPIATES: POSITIVE — AB
Tetrahydrocannabinol: NOT DETECTED

## 2016-05-01 LAB — BASIC METABOLIC PANEL
ANION GAP: 10 (ref 5–15)
BUN: 10 mg/dL (ref 6–20)
CALCIUM: 9.3 mg/dL (ref 8.9–10.3)
CO2: 27 mmol/L (ref 22–32)
CREATININE: 1.41 mg/dL — AB (ref 0.61–1.24)
Chloride: 104 mmol/L (ref 101–111)
GFR calc Af Amer: >60 mL/min (ref >60)
GFR, EST NON AFRICAN AMERICAN: 60 mL/min — AB (ref >60)
GLUCOSE: 94 mg/dL (ref 65–99)
Potassium: 3.6 mmol/L (ref 3.5–5.1)
Sodium: 141 mmol/L (ref 135–145)

## 2016-05-01 LAB — LIPID PANEL
CHOL/HDL RATIO: 5.8 ratio
Cholesterol: 202 mg/dL — ABNORMAL HIGH (ref 0–200)
HDL: 35 mg/dL — AB (ref >40)
LDL CALC: 123 mg/dL — AB (ref 0–99)
TRIGLYCERIDES: 221 mg/dL — AB (ref <150)
VLDL: 44 mg/dL — AB (ref 0–40)

## 2016-05-01 LAB — TROPONIN I
Troponin I: <0.03 ng/mL (ref <0.031)
Troponin I: <0.03 ng/mL (ref <0.031)
Troponin I: <0.03 ng/mL (ref <0.031)

## 2016-05-01 LAB — TSH: TSH: 0.893 u[IU]/mL (ref 0.350–4.500)

## 2016-05-01 LAB — I-STAT TROPONIN, ED: TROPONIN I, POC: 0 ng/mL (ref 0.00–0.08)

## 2016-05-01 LAB — D-DIMER, QUANTITATIVE: D-Dimer, Quant: <0.27 ug/mL-FEU (ref 0.00–0.50)

## 2016-05-01 MED ORDER — TECHNETIUM TC 99M TETROFOSMIN IV KIT
30.0000 | PACK | Freq: Once | INTRAVENOUS | Status: AC | PRN
  Administered 2016-05-01: 30 via INTRAVENOUS

## 2016-05-01 MED ORDER — ALBUTEROL SULFATE (2.5 MG/3ML) 0.083% IN NEBU: 3.0000 mL | INHALATION_SOLUTION | RESPIRATORY_TRACT | Status: DC | PRN

## 2016-05-01 MED ORDER — ACETAMINOPHEN 325 MG PO TABS: 650.0000 mg | ORAL_TABLET | ORAL | Status: DC | PRN

## 2016-05-01 MED ORDER — IOPAMIDOL (ISOVUE-370) INJECTION 76%
INTRAVENOUS | Status: AC
  Administered 2016-05-01: 70 mL
  Filled 2016-05-01: qty 100

## 2016-05-01 MED ORDER — REGADENOSON 0.4 MG/5ML IV SOLN
INTRAVENOUS | Status: AC
  Administered 2016-05-01: 0.4 mg via INTRAVENOUS
  Filled 2016-05-01: qty 5

## 2016-05-01 MED ORDER — PANTOPRAZOLE SODIUM 40 MG IV SOLR
40.0000 mg | INTRAVENOUS | Status: DC
  Administered 2016-05-01: 40 mg via INTRAVENOUS
  Filled 2016-05-01: qty 40

## 2016-05-01 MED ORDER — GI COCKTAIL ~~LOC~~: 30.0000 mL | Freq: Three times a day (TID) | ORAL | Status: DC | PRN

## 2016-05-01 MED ORDER — ENOXAPARIN SODIUM 60 MG/0.6ML ~~LOC~~ SOLN
60.0000 mg | SUBCUTANEOUS | Status: DC
  Administered 2016-05-01: 60 mg via SUBCUTANEOUS
  Filled 2016-05-01: qty 0.6

## 2016-05-01 MED ORDER — PANTOPRAZOLE SODIUM 40 MG PO TBEC: 40.0000 mg | DELAYED_RELEASE_TABLET | Freq: Every day | ORAL | Status: DC

## 2016-05-01 MED ORDER — TECHNETIUM TC 99M TETROFOSMIN IV KIT
10.0000 | PACK | Freq: Once | INTRAVENOUS | Status: AC | PRN
  Administered 2016-05-01: 10 via INTRAVENOUS

## 2016-05-01 MED ORDER — IOPAMIDOL (ISOVUE-370) INJECTION 76%
INTRAVENOUS | Status: AC
  Filled 2016-05-01: qty 100

## 2016-05-01 MED ORDER — HEPARIN SODIUM (PORCINE) 5000 UNIT/ML IJ SOLN: 5000.0000 [IU] | Freq: Three times a day (TID) | INTRAMUSCULAR | Status: DC

## 2016-05-01 MED ORDER — NITROGLYCERIN 0.4 MG SL SUBL
0.4000 mg | SUBLINGUAL_TABLET | SUBLINGUAL | Status: DC | PRN
  Administered 2016-05-01: 0.4 mg via SUBLINGUAL
  Filled 2016-05-01: qty 1

## 2016-05-01 MED ORDER — GI COCKTAIL ~~LOC~~
30.0000 mL | Freq: Once | ORAL | Status: AC
  Administered 2016-05-01: 30 mL via ORAL
  Filled 2016-05-01: qty 30

## 2016-05-01 MED ORDER — CYCLOBENZAPRINE HCL 10 MG PO TABS: 10.0000 mg | ORAL_TABLET | Freq: Three times a day (TID) | ORAL | Status: DC | PRN

## 2016-05-01 MED ORDER — MORPHINE SULFATE (PF) 2 MG/ML IV SOLN: 2.0000 mg | INTRAVENOUS | Status: DC | PRN

## 2016-05-01 MED ORDER — REGADENOSON 0.4 MG/5ML IV SOLN
0.4000 mg | Freq: Once | INTRAVENOUS | Status: AC
  Administered 2016-05-01: 0.4 mg via INTRAVENOUS

## 2016-05-01 MED ORDER — ASPIRIN 81 MG PO CHEW
81.0000 mg | CHEWABLE_TABLET | Freq: Once | ORAL | Status: AC
  Administered 2016-05-01: 81 mg via ORAL
  Filled 2016-05-01: qty 1

## 2016-05-01 MED ORDER — MOMETASONE FURO-FORMOTEROL FUM 200-5 MCG/ACT IN AERO
2.0000 | INHALATION_SPRAY | Freq: Two times a day (BID) | RESPIRATORY_TRACT | Status: DC
  Filled 2016-05-01: qty 8.8

## 2016-05-01 MED ORDER — ONDANSETRON HCL 4 MG/2ML IJ SOLN: 4.0000 mg | Freq: Four times a day (QID) | INTRAMUSCULAR | Status: DC | PRN

## 2016-05-01 MED ORDER — MORPHINE SULFATE (PF) 4 MG/ML IV SOLN
4.0000 mg | Freq: Once | INTRAVENOUS | Status: AC
  Administered 2016-05-01: 4 mg via INTRAVENOUS
  Filled 2016-05-01: qty 1

## 2016-05-01 NOTE — Progress Notes (Signed)
  Patient presented to [facility] for stress test. Completed w/o complication. Radiologist interpretation pending.   IM serving as primary. Call if official cardiology consultation is needed.  Carrissa Taitano Sharol HarnessSimmons 05/01/2016

## 2016-05-01 NOTE — ED Provider Notes (Signed)
CSN: [id number]     Arrival date & time 05/01/16  0123 History  By signing my name below, I, Gavin Henry, attest that this documentation has been prepared under the direction and in the presence of Gavin Boozeavid Antonette Hendricks, MD. Electronically Signed: [name], ED Scribe. 05/01/2016. 1:58 AM    Chief Complaint  Patient presents with  . Chest Pain   The history is provided by the patient. No language interpreter was used.   Gavin Henry is a 45 y.o. male with PMHx of asthma who presents to the Emergency Department complaining of worsening, 6/10 in severity, waxing and waning, squeezing, central chest pain with onset approximately 2 hours ago near 11:45 PM.  The pain is not worse with deep breathing and this feels different than pain that he has had in the past with asthma. He initially thought that the pain may be related to muscle spasm and took Flexeril without any relief in pain.   Pt denies SOB, nausea, vomiting, and sweating. He is a former smoker. No history of cardiac disease, HTN, or DM. His cholesterol was high at his last health insurance screening. No significant family history of cardiac disease. No history of DVT/PE. No recent surgery. [place]   Past Medical History  Diagnosis Date  . Asthma    Past Surgical History  Procedure Laterality Date  . Hernia repair    . Hemorrhoid surgery     Family History  Problem Relation Age of Onset  . Cancer Mother   . Diabetes Father   . Kidney disease Father   . Cancer Maternal Aunt   . Asthma Paternal Aunt   . Stroke Maternal Grandfather   . Cancer Maternal Grandfather    Social History  Substance Use Topics  . Smoking status: Former Smoker    Types: Cigarettes  . Smokeless tobacco: None     Comment: quit April 2012  . Alcohol Use: 0.0 oz/week     Comment: social    Review of Systems  Constitutional: Negative for fever, chills and diaphoresis.  Respiratory: Negative for shortness of breath.    Cardiovascular: Positive for chest pain. Negative for leg swelling.  Gastrointestinal: Negative for nausea and vomiting.  All other systems reviewed and are negative.  Allergies  Review of patient's allergies indicates no known allergies.  Home Medications   Prior to Admission medications   Medication Sig Start Date End Date Taking? Authorizing Provider  albuterol (VENTOLIN HFA) 108 (90 BASE) MCG/ACT inhaler Inhale 1-2 puffs into the lungs every 4 (four) hours as needed for wheezing or shortness of breath. 11/28/15   Collie SiadStephanie D English, PA  ALPRAZolam Prudy Feeler(XANAX) 1 MG tablet TAKE 1/2 TO 1 TABLET BY MOUTH AT BEDTIME 09/11/14   Shade FloodJeffrey R Greene, MD  azithromycin (ZITHROMAX) 250 MG tablet Take 2 tabs PO x 1 dose, then 1 tab PO QD x 4 days 11/28/15   Collie SiadStephanie D English, PA  cetirizine (ZYRTEC) 10 MG tablet Take 10 mg by mouth daily.    Historical Provider, MD  cyclobenzaprine (FLEXERIL) 10 MG tablet Take 1 tablet (10 mg total) by mouth 3 (three) times daily as needed for muscle spasms. 06/22/15   Collie SiadStephanie D English, PA  Fluticasone-Salmeterol (ADVAIR DISKUS) 250-50 MCG/DOSE AEPB Inhale 1 puff into the lungs 2 (two) times daily. 11/28/15   Collie SiadStephanie D English, PA  Guaifenesin (MUCINEX MAXIMUM STRENGTH) 1200 MG TB12 Take 1 tablet (1,200 mg total) by mouth every 12 (twelve) hours as needed. 11/28/15  Collie Siad English, PA  ipratropium (ATROVENT) 0.03 % nasal spray Place 2 sprays into both nostrils 2 (two) times daily. 11/28/15   Collie Siad English, PA  loratadine (CLARITIN) 10 MG tablet Take 10 mg by mouth daily.    Historical Provider, MD  meloxicam (MOBIC) 15 MG tablet Take 1 tablet (15 mg total) by mouth daily. Patient not taking: Reported on 11/28/2015 06/22/15   Collie Siad English, PA  ranitidine (ZANTAC) 150 MG tablet Take 150 mg by mouth 2 (two) times daily. Reported on 11/28/2015    Historical Provider, MD  traMADol (ULTRAM) 50 MG tablet Take 1 tablet (50 mg total) by mouth every 8 (eight)  hours as needed. Patient not taking: Reported on 06/22/2015 11/09/13   Thao P Le, DO   BP 174/102 mmHg  Pulse 121  Temp(Src) 97.8 F (36.6 C) (Oral)  Resp 16  Ht 5\' 10"  (1.778 m)  Wt 261 lb 4.8 oz (118.525 kg)  BMI 37.49 kg/m2  SpO2 94% Physical Exam  Constitutional: He is oriented to person, place, and time. He appears well-developed and well-nourished.  HENT:  Head: Normocephalic and atraumatic.  Eyes: EOM are normal.  Neck: Normal range of motion.  Cardiovascular: Regular rhythm, normal heart sounds and intact distal pulses.  Tachycardia present.   Pulmonary/Chest: Effort normal and breath sounds normal. No respiratory distress.  Abdominal: Soft. He exhibits no distension. There is no tenderness.  Musculoskeletal: Normal range of motion.  Neurological: He is alert and oriented to person, place, and time.  Skin: Skin is warm and dry.  Psychiatric: He has a normal mood and affect. Judgment normal.  Nursing note and vitals reviewed.   ED Course  Procedures (including critical care time) DIAGNOSTIC STUDIES: Oxygen Saturation is 94% on RA, adequate by my interpretation.    COORDINATION OF CARE: 1:52 AM Discussed treatment plan which includes lab work, CXR, EKG with pt at bedside and pt agreed to plan.  Labs Review Results for orders placed or performed during the hospital encounter of 05/01/16  Basic metabolic panel  Result Value Ref Range   Sodium 141 135 - 145 mmol/L   Potassium 3.6 3.5 - 5.1 mmol/L   Chloride 104 101 - 111 mmol/L   CO2 27 22 - 32 mmol/L   Glucose, Bld 94 65 - 99 mg/dL   BUN 10 6 - 20 mg/dL   Creatinine, Ser 9.52 (H) 0.61 - 1.24 mg/dL   Calcium 9.3 8.9 - 84.1 mg/dL   GFR calc non Af Amer 60 (L) >60 mL/min   GFR calc Af Amer >60 >60 mL/min   Anion gap 10 5 - 15  CBC  Result Value Ref Range   WBC 10.1 4.0 - 10.5 K/uL   RBC 5.25 4.22 - 5.81 MIL/uL   Hemoglobin 16.1 13.0 - 17.0 g/dL   HCT 32.4 40.1 - 02.7 %   MCV 90.5 78.0 - 100.0 fL   MCH 30.7  26.0 - 34.0 pg   MCHC 33.9 30.0 - 36.0 g/dL   RDW 25.3 66.4 - 40.3 %   Platelets 369 150 - 400 K/uL  D-dimer, quantitative  Result Value Ref Range   D-Dimer, Quant <0.27 0.00 - 0.50 ug/mL-FEU  Troponin I  Result Value Ref Range   Troponin I <0.03 <0.031 ng/mL  I-stat troponin, ED  Result Value Ref Range   Troponin i, poc 0.00 0.00 - 0.08 ng/mL   Comment 3           Imaging Review Dg  Chest 2 View  05/01/2016  CLINICAL DATA:  Mid chest pain tonight. EXAM: CHEST  2 VIEW COMPARISON:  06/22/2015 FINDINGS: Normal heart size and pulmonary vascularity. Mediastinal contours appear intact. Linear opacities in the lung bases are unchanged since previous study, likely fibrosis. Mild hyperinflation suggesting emphysema. No focal airspace disease or consolidation. Thoracic scoliosis convex towards the right. IMPRESSION: No evidence of active pulmonary disease. Probable emphysematous changes in the lungs with linear fibrosis in the lung bases. Electronically Signed   By: Burman Nieves M.D.   On: 05/01/2016 02:18   Ct Angio Chest Pe W/cm &/or Wo Cm  05/01/2016  CLINICAL DATA:  Initial evaluation for acute severe mid chest pain. Shortness of breath. History of asthma. EXAM: CT ANGIOGRAPHY CHEST WITH CONTRAST TECHNIQUE: Multidetector CT imaging of the chest was performed using the standard protocol during bolus administration of intravenous contrast. Multiplanar CT image reconstructions and MIPs were obtained to evaluate the vascular anatomy. CONTRAST:  70 cc of Isovue 370. COMPARISON:  Prior radiograph from earlier the same day. FINDINGS: Visualized thyroid gland is normal in appearance. No pathologically enlarged mediastinal, hilar, or axillary lymph nodes identified. Intrathoracic aorta of normal caliber and appearance without acute abnormality. Visualized great vessels within normal limits. Heart size at the upper limits of normal.  No pericardial effusion. Pulmonary arteries are adequately opacified for  evaluation. See main pulmonary artery dilated to 3.7 cm, suggestive of underlying pulmonary hypertension. No filling defect to suggest acute pulmonary embolism identified. Re-formatted imaging confirms these findings. Scattered linear opacities within the bilateral lower lobes, right middle lobe, and lingula are most consistent with atelectasis. No focal infiltrates identified. No pulmonary edema or pleural effusion. No pneumothorax. No worrisome pulmonary nodule or mass. Partially visualized upper abdomen demonstrates no acute abnormality. Partially visualized pancreas is mildly prominent without acute inflammatory changes. Esophagus mildly patulous with question of mild circumferential esophageal wall thickening, which may reflect esophagitis or sequela of reflux disease. Scoliosis noted. No acute osseous abnormality. No worrisome lytic or blastic osseous lesions. IMPRESSION: 1. No CT evidence for acute pulmonary embolism. 2. No other acute cardiopulmonary abnormality identified. 3. Mild dilatation of the main pulmonary artery, suggestive of underlying pulmonary hypertension. 4. Scattered atelectasis within the bilateral lower lobes, right middle lobe, and lingula. 5. Mild circumferential esophageal wall thickening, which may be related to underlying reflux disease or possibly esophagitis. Electronically Signed   By: Rise Mu M.D.   On: 05/01/2016 04:15   I have personally reviewed and evaluated these images and lab results as part of my medical decision-making.   EKG Interpretation   Date/Time:  Thursday May 01 2016 01:29:15 EDT Ventricular Rate:  124 PR Interval:  144 QRS Duration: 84 QT Interval:  302 QTC Calculation: 433 R Axis:   102 Text Interpretation:  Sinus tachycardia Rightward axis Borderline ECG No  old tracing to compare Confirmed by [facility]  MD, Takashi Korol ([id number]) on 05/01/2016  1:33:38 AM      MDM   Final diagnoses:  Chest pain, unspecified chest pain type    Chest pain  of uncertain cause. He is a former smoker and also just came back from [facility] with airline trip putting him at risk for pulmonary embolism. ECG is unremarkable as is initial troponin. He is given aspirin and nitroglycerin with no relief. D-dimer is obtained and is normal. He had no relief of discomfort with nitroglycerin. Concern for pulmonary embolism was such that I chose to get CT angiogram in spite of normal d-dimer  and this did show dilated pulmonary artery suggesting mild pulmonary hypertension. He has been given morphine for pain which has given fairly good relief. He was given a GI cocktail and repeat troponin has been ordered. Case is discussed with Dr. Julian Reil of [facility] who agrees to admit the patient and observation status.  I personally performed the services described in this documentation, which was scribed in my presence. The recorded information has been reviewed and is accurate.      Gavin Booze, MD 05/01/16 [phone]

## 2016-05-01 NOTE — Progress Notes (Signed)
Patient discharge instructions gone over with patient. All questions answered to patient satisfaction. Patient telemetry discontinued. Iv removed intact. Patient discharged to home with family by Linney of wheelchair.

## 2016-05-01 NOTE — Plan of Care (Signed)
Problem: Consults Goal: Skin Care Protocol Initiated - if Braden Score 18 or less If consults are not indicated, leave blank or document N/A Outcome: Not Applicable Date Met:  81/01/75 braden > 18 Goal: Tobacco Cessation referral if indicated Outcome: Not Applicable Date Met:  10/08/84 Pt quit smoking Goal: Diabetes Guidelines if Diabetic/Glucose > 140 If diabetic or lab glucose is > 140 mg/dl - Initiate Diabetes/Hyperglycemia Guidelines & Document Interventions  Outcome: Not Applicable Date Met:  27/78/24 Pt is not diabetic  Problem: Phase I Progression Outcomes Goal: Aspirin unless contraindicated Outcome: Completed/Met Date Met:  05/01/16 ASA given in ED Goal: MD aware of Cardiac Marker results Outcome: Progressing Troponin negative times 1.  Problem: Phase II Progression Outcomes Goal: Stress Test if indicated Outcome: Progressing Pt on schedule to go have a stress test done in a few hours.

## 2016-05-01 NOTE — ED Notes (Signed)
Pt. reports central chest pain onset 11 pm last night with mild SOB , denies nausea or diaphoresis .

## 2016-05-01 NOTE — Discharge Summary (Signed)
Physician Discharge Summary  Gavin Henry WUJ:[id number] DOB: 28-Dec-1970 DOA: 05/01/2016  PCP: Shade Flood, MD  Admit date: 05/01/2016 Discharge date: 05/01/2016   Recommendations for Outpatient Follow-Up:   1. Patient was discharged home on PPI therapy and encouraged follow-up with his PCP in 3 weeks.   Discharge Diagnosis:   Principal Problem:    Chest pain with low risk for cardiac etiology Active Problems:    Esophageal dysmotility    Hiatal hernia    Morbid obesity (HCC)    Dyslipidemia   Discharge disposition:  Home.    Discharge Condition: Improved.  Diet recommendation: Low sodium, heart healthy.    History of Present Illness:   Gavin Henry is an 45 y.o. male with a PMH of asthma who was admitted 05/01/16 with chief complaint of the sudden onset of 6/10 chest pain. CT angiogram done on presentation was negative for pulmonary embolism but suggested mild pulmonary hypertension and possible esophagitis. Screening troponin was negative.  Hospital Course by Problem:   Principal Problem:   Chest pain with low risk for cardiac etiology  The patient was admitted to rule out acute coronary syndrome. His heart score was 2. Troponin negative. 12-lead EKG showed right axis deviation but otherwise was without ischemic changes. Heart rate was 124. TSH WNL. Lipid panel showed mild hyperlipidemia with cholesterol of 202 and LDL of 123. Stress testing was done and the patient was considered low risk with no ischemia.  Active Problems:   Esophageal dysmotility/Hiatal hernia The patient had a barium swallow done which showed a small hiatal hernia and esophageal dysmotility which was likely the cause of his chest symptoms. He was placed on PPI therapy and weight loss was encouraged.    Morbid obesity (HCC) Weight loss discussed and encouraged.    Dyslipidemia Trial of diet changes with follow-up with his PCP recommended.    Medical Consultants:     None.   Discharge Exam:   Filed Vitals:   05/01/16 0936 05/01/16 1245  BP: 129/84 124/74  Pulse:  102  Temp:  99 F (37.2 C)  Resp:     Filed Vitals:   05/01/16 0932 05/01/16 0934 05/01/16 0936 05/01/16 1245  BP: 136/86 125/85 129/84 124/74  Pulse:    102  Temp:    99 F (37.2 C)  TempSrc:    Oral  Resp:      Height:      Weight:      SpO2:    97%    Gen:  NAD Cardiovascular:  RRR, No M/R/G Respiratory: Lungs CTAB Gastrointestinal: Abdomen soft, NT/ND with normal active bowel sounds. Extremities: No C/E/C   The results of significant diagnostics from this hospitalization (including imaging, microbiology, ancillary and laboratory) are listed below for reference.     Procedures and Diagnostic Studies:   Dg Chest 2 View  05/01/2016  CLINICAL DATA:  Mid chest pain tonight. EXAM: CHEST  2 VIEW COMPARISON:  06/22/2015 FINDINGS: Normal heart size and pulmonary vascularity. Mediastinal contours appear intact. Linear opacities in the lung bases are unchanged since previous study, likely fibrosis. Mild hyperinflation suggesting emphysema. No focal airspace disease or consolidation. Thoracic scoliosis convex towards the right. IMPRESSION: No evidence of active pulmonary disease. Probable emphysematous changes in the lungs with linear fibrosis in the lung bases. Electronically Signed   By: Burman Nieves M.D.   On: 05/01/2016 02:18   Ct Angio Chest Pe W/cm &/or Wo Cm  05/01/2016  CLINICAL DATA:  Initial evaluation for acute severe  mid chest pain. Shortness of breath. History of asthma. EXAM: CT ANGIOGRAPHY CHEST WITH CONTRAST TECHNIQUE: Multidetector CT imaging of the chest was performed using the standard protocol during bolus administration of intravenous contrast. Multiplanar CT image reconstructions and MIPs were obtained to evaluate the vascular anatomy. CONTRAST:  70 cc of Isovue 370. COMPARISON:  Prior radiograph from earlier the same day. FINDINGS: Visualized thyroid  gland is normal in appearance. No pathologically enlarged mediastinal, hilar, or axillary lymph nodes identified. Intrathoracic aorta of normal caliber and appearance without acute abnormality. Visualized great vessels within normal limits. Heart size at the upper limits of normal.  No pericardial effusion. Pulmonary arteries are adequately opacified for evaluation. See main pulmonary artery dilated to 3.7 cm, suggestive of underlying pulmonary hypertension. No filling defect to suggest acute pulmonary embolism identified. Re-formatted imaging confirms these findings. Scattered linear opacities within the bilateral lower lobes, right middle lobe, and lingula are most consistent with atelectasis. No focal infiltrates identified. No pulmonary edema or pleural effusion. No pneumothorax. No worrisome pulmonary nodule or mass. Partially visualized upper abdomen demonstrates no acute abnormality. Partially visualized pancreas is mildly prominent without acute inflammatory changes. Esophagus mildly patulous with question of mild circumferential esophageal wall thickening, which may reflect esophagitis or sequela of reflux disease. Scoliosis noted. No acute osseous abnormality. No worrisome lytic or blastic osseous lesions. IMPRESSION: 1. No CT evidence for acute pulmonary embolism. 2. No other acute cardiopulmonary abnormality identified. 3. Mild dilatation of the main pulmonary artery, suggestive of underlying pulmonary hypertension. 4. Scattered atelectasis within the bilateral lower lobes, right middle lobe, and lingula. 5. Mild circumferential esophageal wall thickening, which may be related to underlying reflux disease or possibly esophagitis. Electronically Signed   By: Rise Mu M.D.   On: 05/01/2016 04:15   Dg Esophagus  05/01/2016  CLINICAL DATA:  Chest pain. EXAM: ESOPHOGRAM / BARIUM SWALLOW / BARIUM TABLET STUDY TECHNIQUE: Combined double contrast and single contrast examination performed using  effervescent crystals, thick barium liquid, and thin barium liquid. The patient was observed with fluoroscopy swallowing a 13 mm barium sulphate tablet. FLUOROSCOPY TIME:  If the device does not provide the exposure index: Fluoroscopy Time:  2 minutes and 24 seconds Number of Acquired Images:  None COMPARISON:  Chest CT of earlier today FINDINGS: Double contrast evaluation of the esophagus demonstrates no mucosal abnormality. Evaluation of primary peristalsis demonstrates a normal primary peristaltic wave. There is secondary escape with contrast stasis in the mid and upper esophagus, mild. Full column evaluation of the esophagus demonstrates a tiny hiatal hernia. Suboptimal distention of the lower esophagus. A 13 mm barium tablet passes promptly. IMPRESSION: 1. Tiny hiatal hernia. 2. Secondary escape wave with contrast stasis in the esophagus, mild. This may represent mild esophageal dysmotility. Electronically Signed   By: Jeronimo Greaves M.D.   On: 05/01/2016 14:17   Nm Myocar Multi W/spect W/wall Motion / Ef  05/01/2016   There was no ST segment deviation noted during stress.  Defect 1: There is a medium defect of mild severity present in the basal inferior and mid inferior location. This likely represents diaphragmatic attenuation artifact.  This is a low risk study. No ischemia.  Nuclear stress EF: 54%.  Donato Schultz, MD     Labs:   Basic Metabolic Panel:  Recent Labs Lab 05/01/16 0130  NA 141  K 3.6  CL 104  CO2 27  GLUCOSE 94  BUN 10  CREATININE 1.41*  CALCIUM 9.3   GFR Estimated Creatinine Clearance: 87.1  mL/min (by C-G formula based on Cr of 1.41). Liver Function Tests: No results for input(s): AST, ALT, ALKPHOS, BILITOT, PROT, ALBUMIN in the last 168 hours. No results for input(s): LIPASE, AMYLASE in the last 168 hours. No results for input(s): AMMONIA in the last 168 hours. Coagulation profile No results for input(s): INR, PROTIME in the last 168 hours.  CBC:  Recent  Labs Lab 05/01/16 0130  WBC 10.1  HGB 16.1  HCT 47.5  MCV 90.5  PLT 369   Cardiac Enzymes:  Recent Labs Lab 05/01/16 0439 05/01/16 0633 05/01/16 1156  TROPONINI <0.03 <0.03 <0.03   D-Dimer  Recent Labs  05/01/16 0130  DDIMER <0.27   Lipid Profile  Recent Labs  05/01/16 1156  CHOL 202*  HDL 35*  LDLCALC 123*  TRIG 221*  CHOLHDL 5.8   Thyroid function studies  Recent Labs  05/01/16 1156  TSH 0.893     Discharge Instructions:       Discharge Instructions    Call MD for:  persistant nausea and vomiting    Complete by:  As directed      Call MD for:  severe uncontrolled pain    Complete by:  As directed      Diet - low sodium heart healthy    Complete by:  As directed      Increase activity slowly    Complete by:  As directed             Medication List    TAKE these medications        albuterol 108 (90 Base) MCG/ACT inhaler  Commonly known as:  VENTOLIN HFA  Inhale 1-2 puffs into the lungs every 4 (four) hours as needed for wheezing or shortness of breath.     cyclobenzaprine 10 MG tablet  Commonly known as:  FLEXERIL  Take 1 tablet (10 mg total) by mouth 3 (three) times daily as needed for muscle spasms.     Fluticasone-Salmeterol 250-50 MCG/DOSE Aepb  Commonly known as:  ADVAIR DISKUS  Inhale 1 puff into the lungs 2 (two) times daily.     pantoprazole 40 MG tablet  Commonly known as:  PROTONIX  Take 1 tablet (40 mg total) by mouth daily.       Follow-up Information    Follow up with GREENE,JEFFREY R, MD. Schedule an appointment as soon as possible for a visit in 3 weeks.   Specialties:  Family Medicine, Sports Medicine   Why:  Hopital follow up.   Contact information:   [address] [place] [id number] [phone]        Time coordinating discharge: 35 minutes.  Signed:  RAMA,CHRISTINA  Pager [phone] [facility] 05/01/2016, 3:39 PM

## 2016-05-01 NOTE — H&P (Signed)
History and Physical    Gavin Henry ZOX:[id number] DOB: Mar 15, 1971 DOA: 05/01/2016  Referring MD/NP/PA: Dr. Preston Fleeting PCP: Shade Flood, MD Outpatient Specialists: None Patient coming from: ED  Chief Complaint: Chest pain  HPI: Gavin Henry is a 45 y.o. male with medical history significant of asthma.  Patient presents to the ED with c/o sudden onset, worsening, 6/10 severity chest pain.  Symptoms onset 2 hours ago.  Pain is located in center of chest.  No radiation.  Not worse with deep breathing.  Does have recent history of travel (flew back from [facility] yesterday).  No leg pain or swelling.  Former smoker.  No h/o CAD, no DM, no HTN, his cholesterol was high during last health screen he says.  No h/o DVT/PE.  No change in pain with swallowing.  ED Course: D-Dimer negative, CTA chest performed anyhow, but this is also negative for PE.  Did suggest possible mild pulmonary HTN, and question of esophagitis.  Trop negative x1, EKG shows mild tachycardia.  Hospitalist asked to admit as CP R/O  Review of Systems: As per HPI otherwise 10 point review of systems negative.    Past Medical History  Diagnosis Date  . Asthma     Past Surgical History  Procedure Laterality Date  . Hernia repair    . Hemorrhoid surgery       reports that he has quit smoking. His smoking use included Cigarettes. He does not have any smokeless tobacco history on file. He reports that he drinks alcohol. His drug history is not on file.  No Known Allergies  Family History  Problem Relation Age of Onset  . Cancer Mother   . Diabetes Father   . Kidney disease Father   . Cancer Maternal Aunt   . Asthma Paternal Aunt   . Stroke Maternal Grandfather   . Cancer Maternal Grandfather      Prior to Admission medications   Medication Sig Start Date End Date Taking? Authorizing Provider  albuterol (VENTOLIN HFA) 108 (90 BASE) MCG/ACT inhaler Inhale 1-2 puffs into the lungs every 4 (four) hours as needed for  wheezing or shortness of breath. 11/28/15  Yes Collie Siad English, PA  cyclobenzaprine (FLEXERIL) 10 MG tablet Take 1 tablet (10 mg total) by mouth 3 (three) times daily as needed for muscle spasms. 06/22/15  Yes Stephanie D English, PA  Fluticasone-Salmeterol (ADVAIR DISKUS) 250-50 MCG/DOSE AEPB Inhale 1 puff into the lungs 2 (two) times daily. 11/28/15  Yes Garnetta Buddy, PA    Physical Exam: Filed Vitals:   05/01/16 0150 05/01/16 0200 05/01/16 0230 05/01/16 0235  BP:  146/96 155/96 117/70  Pulse:  111 104 112  Temp:      TempSrc:      Resp:  Height:      Weight:      SpO2: 98% 98% 98% 98%      Constitutional: NAD, calm, comfortable Filed Vitals:   05/01/16 0150 05/01/16 0200 05/01/16 0230 05/01/16 0235  BP:  146/96 155/96 117/70  Pulse:  111 104 112  Temp:      TempSrc:      Resp:  Height:      Weight:      SpO2: 98% 98% 98% 98%   Eyes: PERRL, lids and conjunctivae normal ENMT: Mucous membranes are moist. Posterior pharynx clear of any exudate or lesions.Normal dentition.  Neck: normal, supple, no masses, no thyromegaly Respiratory: clear to auscultation bilaterally, no wheezing,  no crackles. Normal respiratory effort. No accessory muscle use.  Cardiovascular: Regular rate and rhythm, no murmurs / rubs / gallops. No extremity edema. 2+ pedal pulses. No carotid bruits.  Abdomen: no tenderness, no masses palpated. No hepatosplenomegaly. Bowel sounds positive.  Musculoskeletal: no clubbing / cyanosis. No joint deformity upper and lower extremities. Good ROM, no contractures. Normal muscle tone.  Skin: no rashes, lesions, ulcers. No induration Neurologic: CN 2-12 grossly intact. Sensation intact, DTR normal. Strength 5/5 in all 4.  Psychiatric: Normal judgment and insight. Alert and oriented x 3. Normal mood.    Labs on Admission: I have personally reviewed following labs and imaging studies  CBC:  Recent Labs Lab 05/01/16 0130  WBC 10.1    HGB 16.1  HCT 47.5  MCV 90.5  PLT 369   Basic Metabolic Panel:  Recent Labs Lab 05/01/16 0130  NA 141  K 3.6  CL 104  CO2 27  GLUCOSE 94  BUN 10  CREATININE 1.41*  CALCIUM 9.3   GFR: Estimated Creatinine Clearance: 87.1 mL/min (by C-G formula based on Cr of 1.41). Liver Function Tests: No results for input(s): AST, ALT, ALKPHOS, BILITOT, PROT, ALBUMIN in the last 168 hours. No results for input(s): LIPASE, AMYLASE in the last 168 hours. No results for input(s): AMMONIA in the last 168 hours. Coagulation Profile: No results for input(s): INR, PROTIME in the last 168 hours. Cardiac Enzymes:  Recent Labs Lab 05/01/16 0439  TROPONINI <0.03   BNP (last 3 results) No results for input(s): PROBNP in the last 8760 hours. HbA1C: No results for input(s): HGBA1C in the last 72 hours. CBG: No results for input(s): GLUCAP in the last 168 hours. Lipid Profile: No results for input(s): CHOL, HDL, LDLCALC, TRIG, CHOLHDL, LDLDIRECT in the last 72 hours. Thyroid Function Tests: No results for input(s): TSH, T4TOTAL, FREET4, T3FREE, THYROIDAB in the last 72 hours. Anemia Panel: No results for input(s): VITAMINB12, FOLATE, FERRITIN, TIBC, IRON, RETICCTPCT in the last 72 hours. Urine analysis: No results found for: COLORURINE, APPEARANCEUR, LABSPEC, PHURINE, GLUCOSEU, HGBUR, BILIRUBINUR, KETONESUR, PROTEINUR, UROBILINOGEN, NITRITE, LEUKOCYTESUR Sepsis Labs: @LABRCNTIP (procalcitonin:4,lacticidven:4) )No results found for this or any previous visit (from the past 240 hour(s)).   Radiological Exams on Admission: Dg Chest 2 View  05/01/2016  CLINICAL DATA:  Mid chest pain tonight. EXAM: CHEST  2 VIEW COMPARISON:  06/22/2015 FINDINGS: Normal heart size and pulmonary vascularity. Mediastinal contours appear intact. Linear opacities in the lung bases are unchanged since previous study, likely fibrosis. Mild hyperinflation suggesting emphysema. No focal airspace disease or consolidation.  Thoracic scoliosis convex towards the right. IMPRESSION: No evidence of active pulmonary disease. Probable emphysematous changes in the lungs with linear fibrosis in the lung bases. Electronically Signed   By: Burman Nieves M.D.   On: 05/01/2016 02:18   Ct Angio Chest Pe W/cm &/or Wo Cm  05/01/2016  CLINICAL DATA:  Initial evaluation for acute severe mid chest pain. Shortness of breath. History of asthma. EXAM: CT ANGIOGRAPHY CHEST WITH CONTRAST TECHNIQUE: Multidetector CT imaging of the chest was performed using the standard protocol during bolus administration of intravenous contrast. Multiplanar CT image reconstructions and MIPs were obtained to evaluate the vascular anatomy. CONTRAST:  70 cc of Isovue 370. COMPARISON:  Prior radiograph from earlier the same day. FINDINGS: Visualized thyroid gland is normal in appearance. No pathologically enlarged mediastinal, hilar, or axillary lymph nodes identified. Intrathoracic aorta of normal caliber and appearance without acute abnormality. Visualized great vessels within normal limits. Heart size at the  upper limits of normal.  No pericardial effusion. Pulmonary arteries are adequately opacified for evaluation. See main pulmonary artery dilated to 3.7 cm, suggestive of underlying pulmonary hypertension. No filling defect to suggest acute pulmonary embolism identified. Re-formatted imaging confirms these findings. Scattered linear opacities within the bilateral lower lobes, right middle lobe, and lingula are most consistent with atelectasis. No focal infiltrates identified. No pulmonary edema or pleural effusion. No pneumothorax. No worrisome pulmonary nodule or mass. Partially visualized upper abdomen demonstrates no acute abnormality. Partially visualized pancreas is mildly prominent without acute inflammatory changes. Esophagus mildly patulous with question of mild circumferential esophageal wall thickening, which may reflect esophagitis or sequela of reflux  disease. Scoliosis noted. No acute osseous abnormality. No worrisome lytic or blastic osseous lesions. IMPRESSION: 1. No CT evidence for acute pulmonary embolism. 2. No other acute cardiopulmonary abnormality identified. 3. Mild dilatation of the main pulmonary artery, suggestive of underlying pulmonary hypertension. 4. Scattered atelectasis within the bilateral lower lobes, right middle lobe, and lingula. 5. Mild circumferential esophageal wall thickening, which may be related to underlying reflux disease or possibly esophagitis. Electronically Signed   By: Rise MuBenjamin  McClintock M.D.   On: 05/01/2016 04:15    EKG: Independently reviewed.  Assessment/Plan Active Problems:   Chest pain with low risk for cardiac etiology   Chest pain low risk - HEART score of 2 but EDP requests admit anyhow for rule out.  CP obs pathway  NPO  Stress test ordered  Serial trops  Tele monitor  Morphine PRN pain  Trying GI cocktail to see if this provides any relief with the questionable esophagitis on CT scan  CT negative for PE and no mention of dissection.     DVT prophylaxis: Lovenox Code Status: Full Family Communication: no family in room Consults called: None Admission status: Admit to obs   GARDNER, Heywood IlesJARED M. DO [facility] Pager [phone] from 7PM-7AM  If 7AM-7PM, please contact the day physician for the patient [email or web address] Password [email or web address]  05/01/2016, 5:24 AM

## 2016-05-29 ENCOUNTER — Ambulatory Visit (INDEPENDENT_AMBULATORY_CARE_PROVIDER_SITE_OTHER): Payer: BLUE CROSS/BLUE SHIELD | Admitting: Family Medicine

## 2016-05-29 ENCOUNTER — Encounter: Payer: Self-pay | Admitting: Family Medicine

## 2016-05-29 VITALS — BP 121/80 | HR 80 | Temp 98.3°F | Resp 16 | Ht 70.5 in | Wt 260.8 lb

## 2016-05-29 DIAGNOSIS — R0789 Other chest pain: Secondary | ICD-10-CM | POA: Diagnosis not present

## 2016-05-29 DIAGNOSIS — J45909 Unspecified asthma, uncomplicated: Secondary | ICD-10-CM

## 2016-05-29 DIAGNOSIS — K224 Dyskinesia of esophagus: Secondary | ICD-10-CM | POA: Diagnosis not present

## 2016-05-29 NOTE — Patient Instructions (Addendum)
     IF you received an x-ray today, you will receive an invoice from [facility]. Please contact [facility] at [phone] with questions or concerns regarding your invoice.   IF you received labwork today, you will receive an invoice from [facility]. Please contact [facility] at [phone] with questions or concerns regarding your invoice.   Our billing staff will not be able to assist you with questions regarding bills from these companies.  You will be contacted with the lab results as soon as they are available. The fastest [place] to get your results is to activate your My Chart account. Instructions are located on the last page of this paperwork. If you have not heard from us regarding the results in 2 weeks, please contact this office.    I will refer you to a gastroenterologist for the esophageal issues. Continue Protonix once per day, avoid foods that are known to trigger heartburn as listed below. Continue same doses of asthma medicines for now. Follow-up for your physical next month and we can recheck fasting cholesterol test at that time. Breakfast each day, avoiding sugar containing beverages, some form of exercise most if not all days of the week, and portion control are good starts to help with weight management. Return to the clinic or go to the nearest emergency room if any of your symptoms worsen or new symptoms occur.  Food Choices for Gastroesophageal Reflux Disease, Adult When you have gastroesophageal reflux disease (GERD), the foods you eat and your eating habits are very important. Choosing the right foods can help ease the discomfort of GERD. WHAT GENERAL GUIDELINES DO I NEED TO FOLLOW?  Choose fruits, vegetables, whole grains, low-fat dairy products, and low-fat meat, fish, and poultry.  Limit fats such as oils, salad dressings, butter, nuts, and avocado.  Keep a food diary to identify foods that cause symptoms.  Avoid  foods that cause reflux. These may be different for different people.  Eat frequent small meals instead of three large meals each day.  Eat your meals slowly, in a relaxed setting.  Limit fried foods.  Cook foods using methods other than frying.  Avoid drinking alcohol.  Avoid drinking large amounts of liquids with your meals.  Avoid bending over or lying down until 2-3 hours after eating. WHAT FOODS ARE NOT RECOMMENDED? The following are some foods and drinks that may worsen your symptoms: Vegetables Tomatoes. Tomato juice. Tomato and spaghetti sauce. Chili peppers. Onion and garlic. Horseradish. Fruits Oranges, grapefruit, and lemon (fruit and juice). Meats High-fat meats, fish, and poultry. This includes hot dogs, ribs, ham, sausage, salami, and bacon. Dairy Whole milk and chocolate milk. Sour cream. Cream. Butter. Ice cream. Cream cheese.  Beverages Coffee and tea, with or without caffeine. Carbonated beverages or energy drinks. Condiments Hot sauce. Barbecue sauce.  Sweets/Desserts Chocolate and cocoa. Donuts. Peppermint and spearmint. Fats and Oils High-fat foods, including [place] fries and potato chips. Other Vinegar. Strong spices, such as black pepper, white pepper, red pepper, cayenne, curry powder, cloves, ginger, and chili powder. The items listed above may not be a complete list of foods and beverages to avoid. Contact your dietitian for more information.   This information is not intended to replace advice given to you by your health care provider. Make sure you discuss any questions you have with your health care provider.   Document Released: 12/01/2005 Document Revised: 12/22/2014 Document Reviewed: 10/05/2013 Elsevier Interactive Patient Education [employer]2016 [employer]

## 2016-05-29 NOTE — Progress Notes (Signed)
By signing my name below, I, Mesha Guinyard, attest that this documentation has been prepared under the direction and in the presence of Meredith Staggers, MD.  Electronically Signed: Arvilla Market, Medical Scribe. 05/29/2016. 11:52 AM.  Subjective:    Patient ID: Gavin Henry, male    DOB: 1971-01-14, 45 y.o.   MRN: [id number]  HPI Chief Complaint  Patient presents with  . Follow-up    HOSPITAL VISIT ED at  [place] for chest pain    HPI Comments: Gavin Henry is a 45 y.o. male who presents to the [facility] for follow up from hospitalization- admitted May 18th for sudden onset of chest pain. Pt states the pain felt muscular at first and he took a muscle relaxer. The pain kept getting worse over time and felt unsusual so he went to the hospital. Troponins were negative, EKG RAD, but other wise without changes. Negative CT of the chest for PE. Stress test showed low risk no ischemia, mild HLD. He had a barium swallow done showing small hiatal hernia and esophageal dysmotility; thought to be the problem of his chest pains. He was started on Protonix 40 mg QD- denies experiencing any side affects of this medication. Weight loss was also encouraged. Pt reports night sweats. Pt denies chest pains since he left the hospital.  Trouble Swallowing: Pt has had episodic trouble swallowing for years. Pt has a hard time swallowing bread. Pt makes sure he chews his food thoroughly before swallowing. Pt avoids spicy foods to relieve trouble swallowing. Sometimes he has to drink of water to get his food down. Pt has heart burn occasionally. Pt has never seen a gastro neurologist, but is willing to see one.  PMHx of OSA: On CPAP in the past. Referred to [facility] 2015.  PMHx of Asthma: Takes Advair 250-50 BID, has Albuterol PRN. Pt had some issues breathing and used Advare while in [place]- suspects it's due to smoke and dust in the air. Pt uses Albuterol twice a  week.  Exercise & Diet: Pt used to 3 days a week 3 mi on the treadmill for 10 months. Pt states he would over exert himself in the gym. Pt is discouraged because he didn't think he was losing any weight. Pt has tried dieting, but it has never worked out. Pt eats out a lot during the summer. In the winter, he cooks at home more often. Pt doesn't eat breakfast everyday. Pt is willing to talk more about dieting next visit.  Patient Active Problem List   Diagnosis Date Noted  . Chest pain with low risk for cardiac etiology 05/01/2016  . Esophageal dysmotility 05/01/2016  . Hiatal hernia 05/01/2016  . Morbid obesity (HCC) 05/01/2016  . Dyslipidemia 05/01/2016  . HEMORRHOIDS-INTERNAL 10/10/2010   Past Medical History  Diagnosis Date  . Asthma   . Esophageal dysmotility 05/01/2016  . Hiatal hernia 05/01/2016  . Morbid obesity (HCC) 05/01/2016  . Dyslipidemia 05/01/2016   Past Surgical History  Procedure Laterality Date  . Hernia repair    . Hemorrhoid surgery     No Known Allergies Prior to Admission medications   Medication Sig Start Date End Date Taking? Authorizing Provider  albuterol (VENTOLIN HFA) 108 (90 BASE) MCG/ACT inhaler Inhale 1-2 puffs into the lungs every 4 (four) hours as needed for wheezing or shortness of breath. 11/28/15  Yes Collie Siad English, PA  cyclobenzaprine (FLEXERIL) 10 MG tablet Take 1 tablet (10 mg total) by mouth 3 (  three) times daily as needed for muscle spasms. 06/22/15  Yes Stephanie D English, PA  Fluticasone-Salmeterol (ADVAIR DISKUS) 250-50 MCG/DOSE AEPB Inhale 1 puff into the lungs 2 (two) times daily. 11/28/15  Yes Stephanie D English, PA  pantoprazole (PROTONIX) 40 MG tablet Take 1 tablet (40 mg total) by mouth daily. 05/01/16  Yes Maryruth Bun Rama, MD   Social History   Social History  . Marital Status: Single    Spouse Name: N/A  . Number of Children: N/A  . Years of Education: N/A   Occupational History  . Not on file.   Social History Main  Topics  . Smoking status: Former Smoker    Types: Cigarettes  . Smokeless tobacco: Not on file     Comment: quit April 2012  . Alcohol Use: 0.0 oz/week     Comment: social  . Drug Use: Not on file  . Sexual Activity: Not on file   Other Topics Concern  . Not on file   Social History Narrative    Review of Systems  Constitutional: Positive for diaphoresis (night sweats). Negative for fatigue.  HENT: Positive for trouble swallowing.   Cardiovascular: Negative for chest pain.      Objective:  BP 121/80 mmHg  Pulse 80  Temp(Src) 98.3 F (36.8 C) (Oral)  Resp 16  Ht 5' 10.5" (1.791 m)  Wt 260 lb 12.8 oz (118.298 kg)  BMI 36.88 kg/m2  SpO2 95%  Physical Exam  Constitutional: He is oriented to person, place, and time. He appears well-developed and well-nourished.  HENT:  Head: Normocephalic and atraumatic.  Eyes: EOM are normal. Pupils are equal, round, and reactive to light.  Neck: No JVD present. Carotid bruit is not present.  Cardiovascular: Normal rate, regular rhythm and normal heart sounds.   No murmur heard. Pulmonary/Chest: Effort normal and breath sounds normal. He has no rales.  Musculoskeletal: He exhibits no edema.  Neurological: He is alert and oriented to person, place, and time.  Skin: Skin is warm and dry.  Psychiatric: He has a normal mood and affect.  Vitals reviewed.     Assessment & Plan:   Gavin Henry is a 45 y.o. male Asthma, chronic, unspecified asthma severity, uncomplicated  - Overall stable. No medication changes for now.  Other chest pain, Esophageal dysmotility - Plan: Ambulatory referral to Gastroenterology  -Does not appear to be cardiac based on recent evaluations in the hospital. Suspect due to his esophageal dysmotility. Will refer to gastroenterology for further evaluation and possible endoscopy as intermittent long-standing symptoms of sensation of food getting stuck. Differential diagnosis of achalasia versus stricture with  underlying GERD. Continue PPI for now. ER/RTC precautions.  No orders of the defined types were placed in this encounter.   Patient Instructions       IF you received an x-ray today, you will receive an invoice from [facility]. Please contact [facility] at [phone] with questions or concerns regarding your invoice.   IF you received labwork today, you will receive an invoice from [employer]. Please contact [facility] at [phone] with questions or concerns regarding your invoice.   Our billing staff will not be able to assist you with questions regarding bills from these companies.  You will be contacted with the lab results as soon as they are available. The fastest [place] to get your results is to activate your My Chart account. Instructions are located on the last page of this paperwork. If you have not heard from  us regarding the results in 2 weeks, please contact this office.    I will refer you to a gastroenterologist for the esophageal issues. Continue Protonix once per day, avoid foods that are known to trigger heartburn as listed below. Continue same doses of asthma medicines for now. Follow-up for your physical next month and we can recheck fasting cholesterol test at that time. Breakfast each day, avoiding sugar containing beverages, some form of exercise most if not all days of the week, and portion control are good starts to help with weight management. Return to the clinic or go to the nearest emergency room if any of your symptoms worsen or new symptoms occur.  Food Choices for Gastroesophageal Reflux Disease, Adult When you have gastroesophageal reflux disease (GERD), the foods you eat and your eating habits are very important. Choosing the right foods can help ease the discomfort of GERD. WHAT GENERAL GUIDELINES DO I NEED TO FOLLOW?  Choose fruits, vegetables, whole grains, low-fat dairy products, and low-fat meat, fish, and  poultry.  Limit fats such as oils, salad dressings, butter, nuts, and avocado.  Keep a food diary to identify foods that cause symptoms.  Avoid foods that cause reflux. These may be different for different people.  Eat frequent small meals instead of three large meals each day.  Eat your meals slowly, in a relaxed setting.  Limit fried foods.  Cook foods using methods other than frying.  Avoid drinking alcohol.  Avoid drinking large amounts of liquids with your meals.  Avoid bending over or lying down until 2-3 hours after eating. WHAT FOODS ARE NOT RECOMMENDED? The following are some foods and drinks that may worsen your symptoms: Vegetables Tomatoes. Tomato juice. Tomato and spaghetti sauce. Chili peppers. Onion and garlic. Horseradish. Fruits Oranges, grapefruit, and lemon (fruit and juice). Meats High-fat meats, fish, and poultry. This includes hot dogs, ribs, ham, sausage, salami, and bacon. Dairy Whole milk and chocolate milk. Sour cream. Cream. Butter. Ice cream. Cream cheese.  Beverages Coffee and tea, with or without caffeine. Carbonated beverages or energy drinks. Condiments Hot sauce. Barbecue sauce.  Sweets/Desserts Chocolate and cocoa. Donuts. Peppermint and spearmint. Fats and Oils High-fat foods, including [place] fries and potato chips. Other Vinegar. Strong spices, such as black pepper, white pepper, red pepper, cayenne, curry powder, cloves, ginger, and chili powder. The items listed above may not be a complete list of foods and beverages to avoid. Contact your dietitian for more information.   This information is not intended to replace advice given to you by your health care provider. Make sure you discuss any questions you have with your health care provider.   Document Released: 12/01/2005 Document Revised: 12/22/2014 Document Reviewed: 10/05/2013 Elsevier Interactive Patient Education [employer]2016 Elsevier Inc.       I personally performed the  services described in this documentation, which was scribed in my presence. The recorded information has been reviewed and considered, and addended by me as needed.   Signed,   Meredith StaggersJeffrey Daemion Mcniel, MD [facility].  05/31/2016 9:58 AM

## 2016-07-03 ENCOUNTER — Telehealth: Payer: Self-pay | Admitting: Family Medicine

## 2016-07-03 ENCOUNTER — Encounter: Payer: Self-pay | Admitting: Family Medicine

## 2016-07-03 NOTE — Telephone Encounter (Signed)
Patient came into our office for a CPE at 2:51 his appt was at 2:30 we marked him out for a NO SHOW pt states that he was at the [facility] [address] for ten min no one was in the office so he left and came to 5102 I spoke with Crystal because he stated that the door was wide open with no paper on the door that says to go to [address] building for appointments we didn't know if this was true because pt left out mad I was going to go ask Tl because of what he said before I even spoke with Crystal she states that the door was open because they was there fixing the glass but the sign was on the door she sent a picture of the sign on the door to my cell Sorry but he left mad before we could help him

## 2016-07-12 ENCOUNTER — Other Ambulatory Visit: Payer: Self-pay | Admitting: Physician Assistant

## 2016-07-12 DIAGNOSIS — J4530 Mild persistent asthma, uncomplicated: Secondary | ICD-10-CM

## 2016-09-28 IMAGING — CT CT ANGIO CHEST
2 of 6 series · 18 of 36 positions shown · IV contrast (Omni 300)
Comparison: Prior radiograph from earlier the same day.

CLINICAL DATA: Initial evaluation for acute severe mid chest pain.
Shortness of breath. History of asthma.

EXAM:
CT ANGIOGRAPHY CHEST WITH CONTRAST
TECHNIQUE: Multidetector CT imaging of the chest was performed using the
standard protocol during bolus administration of intravenous
contrast. Multiplanar CT image reconstructions and MIPs were
obtained to evaluate the vascular anatomy.
CONTRAST:  70 cc of Isovue 370.

[Series 6: pe thins · axial · 0.81mm/px · z∈[-278,-6]mm · 17 of 603 slices shown]
[im 29/603  lung]
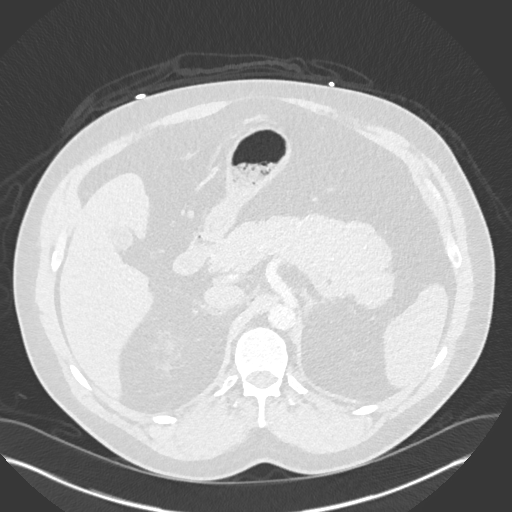
[im 58/603  mediastinal]
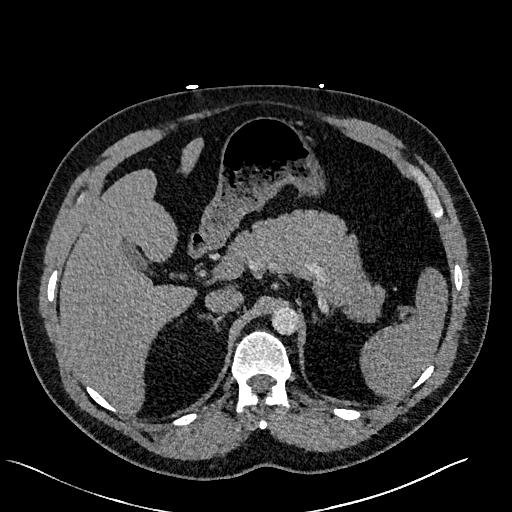
[im 87/603  lung]
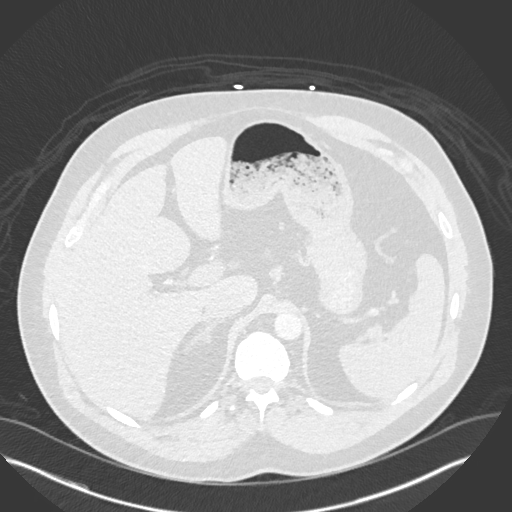
[im 144/603  mediastinal]
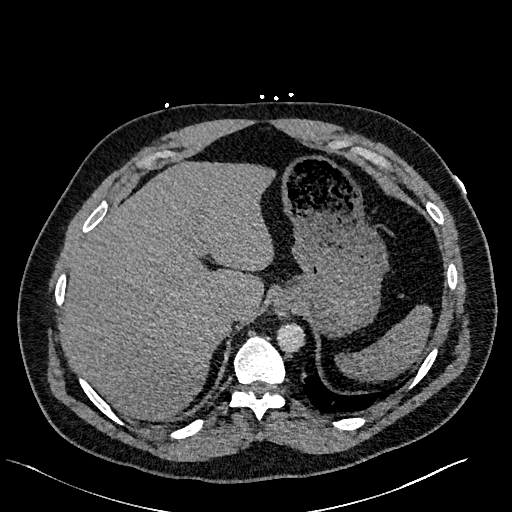
[im 173/603  lung]
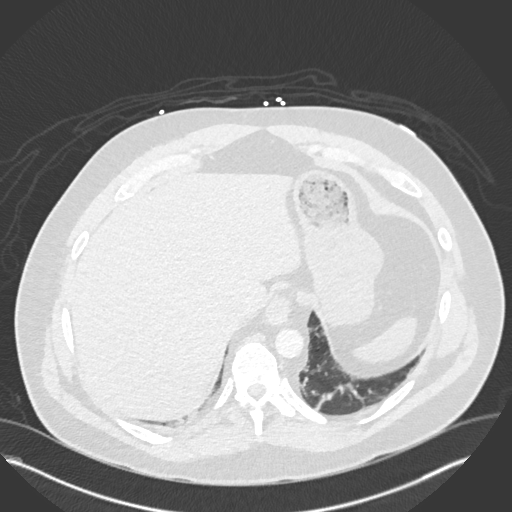
[im 201/603  mediastinal]
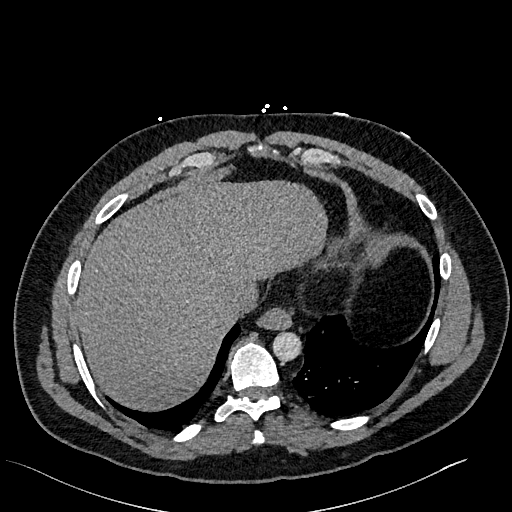
[im 230/603  lung]
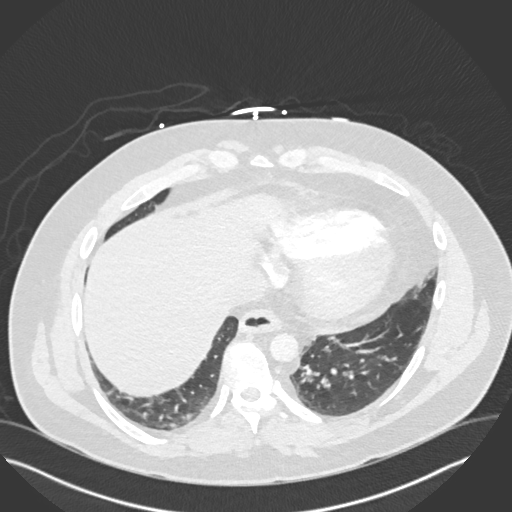
[im 259/603  mediastinal]
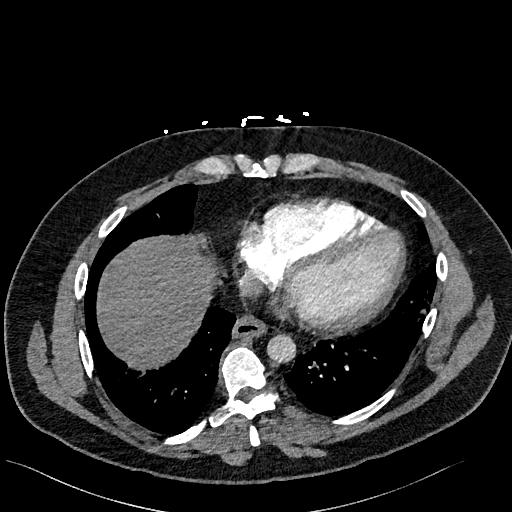
[im 316/603  lung]
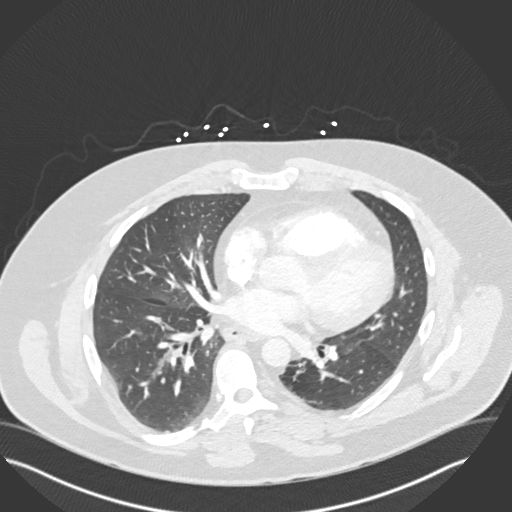
[im 345/603  mediastinal]
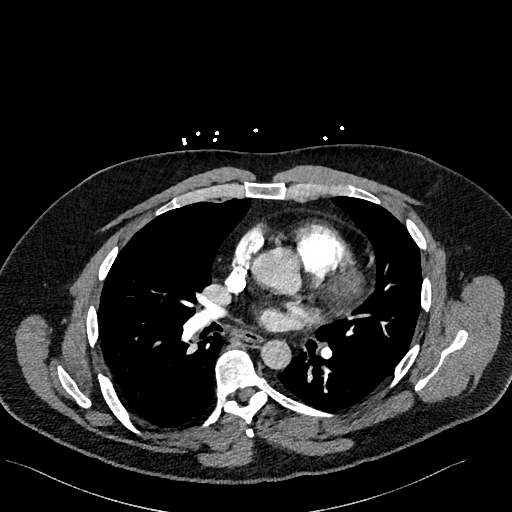
[im 373/603  lung]
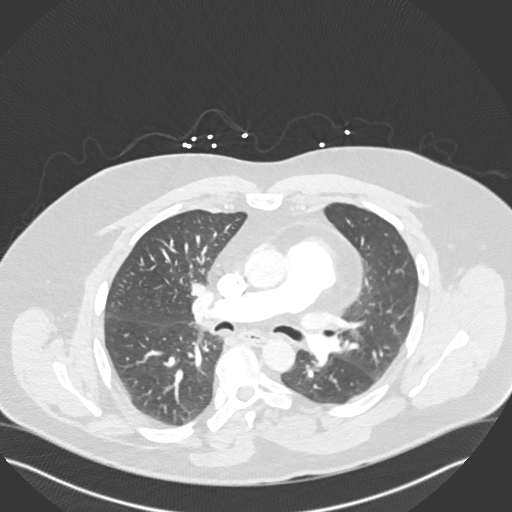
[im 402/603  mediastinal]
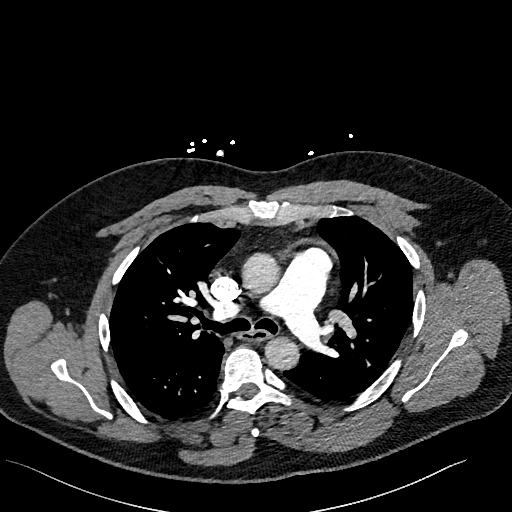
[im 431/603  lung]
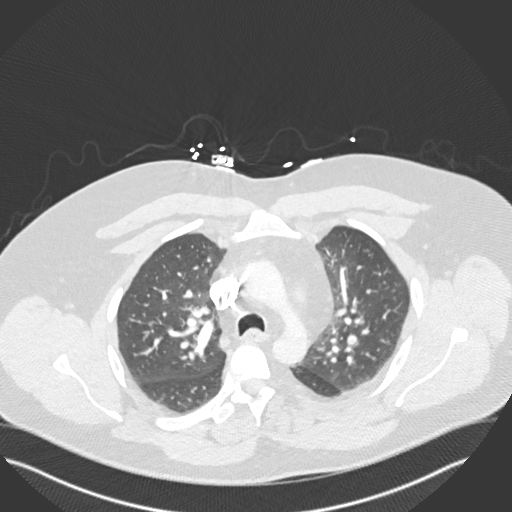
[im 459/603  mediastinal]
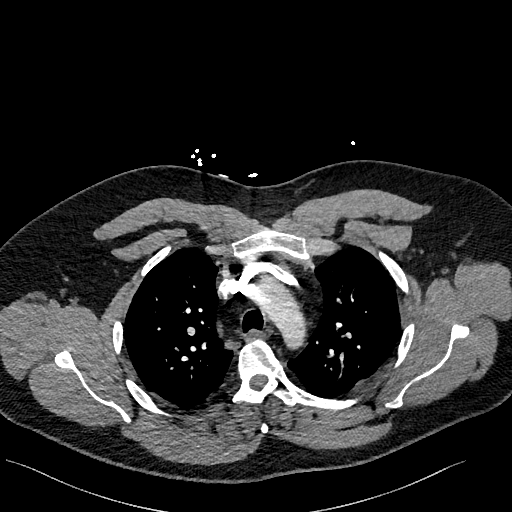
[im 517/603  lung]
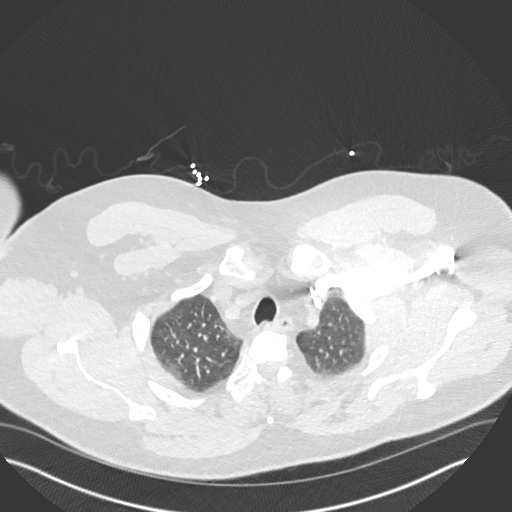
[im 545/603  mediastinal]
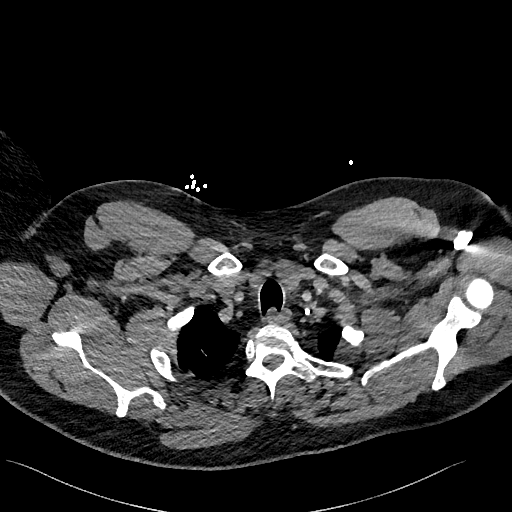
[im 574/603  lung]
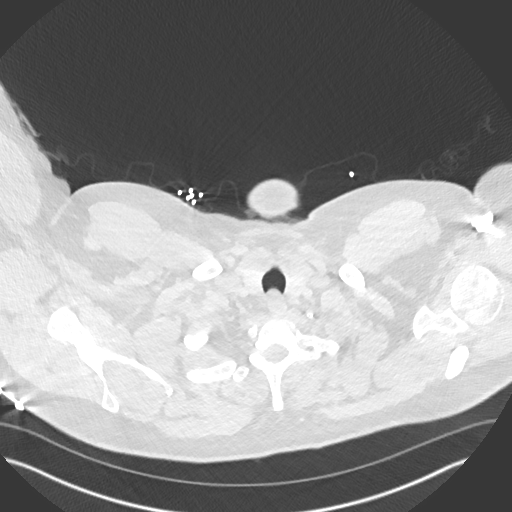

[Series 7: pe 2mm cor · coronal · 0.59mm/px · 1 of 151 slices shown]
[im 76/151  mediastinal]
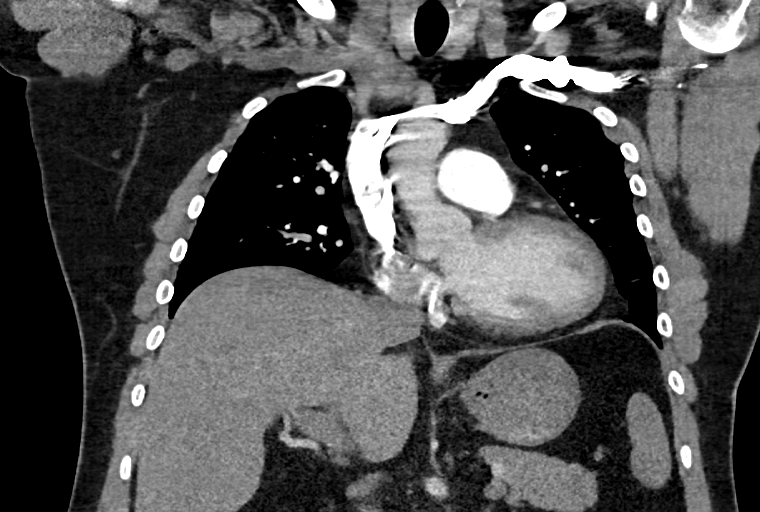

[18 of 36 positions shown; findings below may reference images not displayed]

FINDINGS: Visualized thyroid gland is normal in appearance. No pathologically
enlarged mediastinal, hilar, or axillary lymph nodes identified.

Intrathoracic aorta of normal caliber and appearance without acute
abnormality. Visualized great vessels within normal limits.

Heart size at the upper limits of normal.  No pericardial effusion.

Pulmonary arteries are adequately opacified for evaluation. See main
pulmonary artery dilated to 3.7 cm, suggestive of underlying
pulmonary hypertension. No filling defect to suggest acute pulmonary
embolism identified. Re-formatted imaging confirms these findings.

Scattered linear opacities within the bilateral lower lobes, right
middle lobe, and lingula are most consistent with atelectasis. No
focal infiltrates identified. No pulmonary edema or pleural
effusion. No pneumothorax. No worrisome pulmonary nodule or mass.

Partially visualized upper abdomen demonstrates no acute
abnormality. Partially visualized pancreas is mildly prominent
without acute inflammatory changes.

Esophagus mildly patulous with question of mild circumferential
esophageal wall thickening, which may reflect esophagitis or sequela
of reflux disease.

Scoliosis noted. No acute osseous abnormality. No worrisome lytic or
blastic osseous lesions.
IMPRESSION: 1. No CT evidence for acute pulmonary embolism.
2. No other acute cardiopulmonary abnormality identified.
3. Mild dilatation of the main pulmonary artery, suggestive of
underlying pulmonary hypertension.
4. Scattered atelectasis within the bilateral lower lobes, right
middle lobe, and lingula.
5. Mild circumferential esophageal wall thickening, which may be
related to underlying reflux disease or possibly esophagitis.

## 2017-08-08 ENCOUNTER — Other Ambulatory Visit: Payer: Self-pay | Admitting: Family Medicine

## 2017-08-08 DIAGNOSIS — J4530 Mild persistent asthma, uncomplicated: Secondary | ICD-10-CM

## 2017-08-19 ENCOUNTER — Other Ambulatory Visit: Payer: Self-pay | Admitting: Family Medicine

## 2017-08-19 DIAGNOSIS — J4530 Mild persistent asthma, uncomplicated: Secondary | ICD-10-CM

## 2019-08-09 ENCOUNTER — Ambulatory Visit (INDEPENDENT_AMBULATORY_CARE_PROVIDER_SITE_OTHER): Payer: Self-pay | Admitting: Family Medicine

## 2019-08-09 ENCOUNTER — Other Ambulatory Visit: Payer: Self-pay

## 2019-08-09 ENCOUNTER — Encounter: Payer: Self-pay | Admitting: Family Medicine

## 2019-08-09 VITALS — BP 140/80 | HR 85 | Temp 98.3°F | Resp 14 | Wt 275.6 lb

## 2019-08-09 DIAGNOSIS — S39012A Strain of muscle, fascia and tendon of lower back, initial encounter: Secondary | ICD-10-CM

## 2019-08-09 DIAGNOSIS — Z131 Encounter for screening for diabetes mellitus: Secondary | ICD-10-CM

## 2019-08-09 DIAGNOSIS — M5430 Sciatica, unspecified side: Secondary | ICD-10-CM

## 2019-08-09 LAB — GLUCOSE, POCT (MANUAL RESULT ENTRY): POC Glucose: 67 mg/dl — AB (ref 70–99)

## 2019-08-09 MED ORDER — METHOCARBAMOL 500 MG PO TABS: 500.0000 mg | ORAL_TABLET | Freq: Four times a day (QID) | ORAL | 1 refills | Status: AC

## 2019-08-09 MED ORDER — DICLOFENAC SODIUM 75 MG PO TBEC: 75.0000 mg | DELAYED_RELEASE_TABLET | Freq: Two times a day (BID) | ORAL | 0 refills | Status: AC

## 2019-08-09 MED ORDER — PREDNISONE 20 MG PO TABS: ORAL_TABLET | ORAL | 0 refills | Status: AC

## 2019-08-09 NOTE — Progress Notes (Signed)
Patient ID: Gavin Henry, male    DOB: 10-Jun-1971  Age: 48 y.o. MRN: [id number]  Chief Complaint  Patient presents with  . Back Pain    was setting down a tank felt a snapped in lower right back. Pain has been present for 12 days    Subjective:   48 year old man is been having pain for the last 12 days and is right low back radiating down the leg.  He initially felt a pop when he was lifting and rotating holding a scuba tank.  The pain has continued to persist.  It hurts in the sciatic notch area of the back and radiates down into his calf.  He has trouble getting comfortable.  He said he can get in a position in bed that it does not help.  He has not had this problem in the past.  His weights concerned about the same as last year.  He said he has no high blood sugar in the past but not been labeled as being diabetic.  Only medication is his Advair.  Current allergies, medications, problem list, past/family and social histories reviewed.  Objective:  BP 140/80 (BP Location: Right Arm, Patient Position: Sitting, Cuff Size: Large)   Pulse 85   Temp 98.3 F (36.8 C) (Oral)   Resp 14   Wt 275 lb 9.6 oz (125 kg)   SpO2 96%   BMI 38.99 kg/m   Not particularly tender in the low back.  Pain on anterior flexion.  Not much pain with extension, side to side rotation of trunk rotation.  He has no tenderness in the buttocks S where the pain is.  Right leg raising test is mildly positive on the right at about 40 degrees, flexion about 70 degrees on the left when stretching the hamstrings tight.  There is deep tendon reflexes are minimal and symmetrical bilaterally.  Assessment & Plan:   Assessment: 1. Sciatic leg pain   2. Encounter for screening examination for impaired glucose regulation and diabetes mellitus   3. Lumbar strain, initial encounter       Plan: Lumbar strain and sciatica.  Orders Placed This Encounter  Procedures  . POCT glucose (manual entry)    Meds ordered this encounter   Medications  . predniSONE (DELTASONE) 20 MG tablet    Sig: Take 3 pills daily for 2 days, then 2 daily for 2 days, then 1 daily for 2 days, then one half daily    Dispense:  14 tablet    Refill:  0  . diclofenac (VOLTAREN) 75 MG EC tablet    Sig: Take 1 tablet (75 mg total) by mouth 2 (two) times daily.    Dispense:  30 tablet    Refill:  0  . methocarbamol (ROBAXIN) 500 MG tablet    Sig: Take 1 tablet (500 mg total) by mouth 4 (four) times daily.    Dispense:  30 tablet    Refill:  1         Patient Instructions   Back Exercises These exercises help to make your trunk and back strong. They also help to keep the lower back flexible. Doing these exercises can help to prevent back pain or lessen existing pain.  If you have back pain, try to do these exercises 2-3 times each day or as told by your doctor.  As you get better, do the exercises once each day. Repeat the exercises more often as told by your doctor.  To stop back pain from  coming back, do the exercises once each day, or as told by your doctor. Exercises Single knee to chest Do these steps 3-5 times in a row for each leg: 1. Lie on your back on a firm bed or the floor with your legs stretched out. 2. Bring one knee to your chest. 3. Grab your knee or thigh with both hands and hold them it in place. 4. Pull on your knee until you feel a gentle stretch in your lower back or buttocks. 5. Keep doing the stretch for 10-30 seconds. 6. Slowly let go of your leg and straighten it. Pelvic tilt Do these steps 5-10 times in a row: 1. Lie on your back on a firm bed or the floor with your legs stretched out. 2. Bend your knees so they point up to the ceiling. Your feet should be flat on the floor. 3. Tighten your lower belly (abdomen) muscles to press your lower back against the floor. This will make your tailbone point up to the ceiling instead of pointing down to your feet or the floor. 4. Stay in this position for 5-10  seconds while you gently tighten your muscles and breathe evenly. Cat-cow Do these steps until your lower back bends more easily: 1. Get on your hands and knees on a firm surface. Keep your hands under your shoulders, and keep your knees under your hips. You may put padding under your knees. 2. Let your head hang down toward your chest. Tighten (contract) the muscles in your belly. Point your tailbone toward the floor so your lower back becomes rounded like the back of a cat. 3. Stay in this position for 5 seconds. 4. Slowly lift your head. Let the muscles of your belly relax. Point your tailbone up toward the ceiling so your back forms a sagging arch like the back of a cow. 5. Stay in this position for 5 seconds.  Press-ups Do these steps 5-10 times in a row: 1. Lie on your belly (face-down) on the floor. 2. Place your hands near your head, about shoulder-width apart. 3. While you keep your back relaxed and keep your hips on the floor, slowly straighten your arms to raise the top half of your body and lift your shoulders. Do not use your back muscles. You may change where you place your hands in order to make yourself more comfortable. 4. Stay in this position for 5 seconds. 5. Slowly return to lying flat on the floor.  Bridges Do these steps 10 times in a row: 1. Lie on your back on a firm surface. 2. Bend your knees so they point up to the ceiling. Your feet should be flat on the floor. Your arms should be flat at your sides, next to your body. 3. Tighten your butt muscles and lift your butt off the floor until your waist is almost as high as your knees. If you do not feel the muscles working in your butt and the back of your thighs, slide your feet 1-2 inches farther away from your butt. 4. Stay in this position for 3-5 seconds. 5. Slowly lower your butt to the floor, and let your butt muscles relax. If this exercise is too easy, try doing it with your arms crossed over your chest. Belly  crunches Do these steps 5-10 times in a row: 1. Lie on your back on a firm bed or the floor with your legs stretched out. 2. Bend your knees so they point up to the ceiling.  Your feet should be flat on the floor. 3. Cross your arms over your chest. 4. Tip your chin a little bit toward your chest but do not bend your neck. 5. Tighten your belly muscles and slowly raise your chest just enough to lift your shoulder blades a tiny bit off of the floor. Avoid raising your body higher than that, because it can put too much stress on your low back. 6. Slowly lower your chest and your head to the floor. Back lifts Do these steps 5-10 times in a row: 1. Lie on your belly (face-down) with your arms at your sides, and rest your forehead on the floor. 2. Tighten the muscles in your legs and your butt. 3. Slowly lift your chest off of the floor while you keep your hips on the floor. Keep the back of your head in line with the curve in your back. Look at the floor while you do this. 4. Stay in this position for 3-5 seconds. 5. Slowly lower your chest and your face to the floor. Contact a doctor if:  Your back pain gets a lot worse when you do an exercise.  Your back pain does not get better 2 hours after you exercise. If you have any of these problems, stop doing the exercises. Do not do them again unless your doctor says it is okay. Get help right away if:  You have sudden, very bad back pain. If this happens, stop doing the exercises. Do not do them again unless your doctor says it is okay. This information is not intended to replace advice given to you by your health care provider. Make sure you discuss any questions you have with your health care provider. Document Released: 01/03/2011 Document Revised: 08/26/2018 Document Reviewed: 08/26/2018 Elsevier Patient Education  [employer].  If you have lab work done today you will be contacted with your lab results within the next 2 weeks.  If you  have not heard from [place] then please contact us. The fastest [place] to get your results is to register for My Chart.   IF you received an x-ray today, you will receive an invoice from [facility]. Please contact [facility] at [phone] with questions or concerns regarding your invoice.   IF you received labwork today, you will receive an invoice from [facility]. Please contact [facility] at [phone] with questions or concerns regarding your invoice.   Our billing staff will not be able to assist you with questions regarding bills from these companies.  You will be contacted with the lab results as soon as they are available. The fastest [place] to get your results is to activate your My Chart account. Instructions are located on the last page of this paperwork. If you have not heard from [place] regarding the results in 2 weeks, please contact this office.         No follow-ups on file.   Ruben Reason, MD 08/09/2019

## 2019-08-09 NOTE — Patient Instructions (Addendum)
Take prednisone 3 pills daily for 2 days, then 2 daily for 2 days, then 1 daily for 2 days, then one half daily for inflammation  Take methocarbamol (Robaxin) 1 4 times daily as needed for muscle relaxant  Take diclofenac 1 twice daily for pain and inflammation.  (This is in the same class as ibuprofen and Aleve should not take them while you are on this medicine.)  In addition to all this you can take Tylenol (acetaminophen) 500 mg 2 pills 3 times daily as needed for additional pain relief.  Maximum 3000 mg in 24 hours.  Try to avoid lifting and straining.  If you are getting worse please return.  If you have lab work done today you will be contacted with your lab results within the next 2 weeks.  If you have not heard from us then please contact us. The fastest [place] to get your results is to register for My Chart.   Back Exercises These exercises help to make your trunk and back strong. They also help to keep the lower back flexible. Doing these exercises can help to prevent back pain or lessen existing pain.  If you have back pain, try to do these exercises 2-3 times each day or as told by your doctor.  As you get better, do the exercises once each day. Repeat the exercises more often as told by your doctor.  To stop back pain from coming back, do the exercises once each day, or as told by your doctor. Exercises Single knee to chest Do these steps 3-5 times in a row for each leg: 1. Lie on your back on a firm bed or the floor with your legs stretched out. 2. Bring one knee to your chest. 3. Grab your knee or thigh with both hands and hold them it in place. 4. Pull on your knee until you feel a gentle stretch in your lower back or buttocks. 5. Keep doing the stretch for 10-30 seconds. 6. Slowly let go of your leg and straighten it. Pelvic tilt Do these steps 5-10 times in a row: 1. Lie on your back on a firm bed or the floor with your legs stretched out. 2. Bend your knees so they  point up to the ceiling. Your feet should be flat on the floor. 3. Tighten your lower belly (abdomen) muscles to press your lower back against the floor. This will make your tailbone point up to the ceiling instead of pointing down to your feet or the floor. 4. Stay in this position for 5-10 seconds while you gently tighten your muscles and breathe evenly. Cat-cow Do these steps until your lower back bends more easily: 1. Get on your hands and knees on a firm surface. Keep your hands under your shoulders, and keep your knees under your hips. You may put padding under your knees. 2. Let your head hang down toward your chest. Tighten (contract) the muscles in your belly. Point your tailbone toward the floor so your lower back becomes rounded like the back of a cat. 3. Stay in this position for 5 seconds. 4. Slowly lift your head. Let the muscles of your belly relax. Point your tailbone up toward the ceiling so your back forms a sagging arch like the back of a cow. 5. Stay in this position for 5 seconds.  Press-ups Do these steps 5-10 times in a row: 1. Lie on your belly (face-down) on the floor. 2. Place your hands near your head, about  shoulder-width apart. 3. While you keep your back relaxed and keep your hips on the floor, slowly straighten your arms to raise the top half of your body and lift your shoulders. Do not use your back muscles. You may change where you place your hands in order to make yourself more comfortable. 4. Stay in this position for 5 seconds. 5. Slowly return to lying flat on the floor.  Bridges Do these steps 10 times in a row: 1. Lie on your back on a firm surface. 2. Bend your knees so they point up to the ceiling. Your feet should be flat on the floor. Your arms should be flat at your sides, next to your body. 3. Tighten your butt muscles and lift your butt off the floor until your waist is almost as high as your knees. If you do not feel the muscles working in your  butt and the back of your thighs, slide your feet 1-2 inches farther away from your butt. 4. Stay in this position for 3-5 seconds. 5. Slowly lower your butt to the floor, and let your butt muscles relax. If this exercise is too easy, try doing it with your arms crossed over your chest. Belly crunches Do these steps 5-10 times in a row: 1. Lie on your back on a firm bed or the floor with your legs stretched out. 2. Bend your knees so they point up to the ceiling. Your feet should be flat on the floor. 3. Cross your arms over your chest. 4. Tip your chin a little bit toward your chest but do not bend your neck. 5. Tighten your belly muscles and slowly raise your chest just enough to lift your shoulder blades a tiny bit off of the floor. Avoid raising your body higher than that, because it can put too much stress on your low back. 6. Slowly lower your chest and your head to the floor. Back lifts Do these steps 5-10 times in a row: 1. Lie on your belly (face-down) with your arms at your sides, and rest your forehead on the floor. 2. Tighten the muscles in your legs and your butt. 3. Slowly lift your chest off of the floor while you keep your hips on the floor. Keep the back of your head in line with the curve in your back. Look at the floor while you do this. 4. Stay in this position for 3-5 seconds. 5. Slowly lower your chest and your face to the floor. Contact a doctor if:  Your back pain gets a lot worse when you do an exercise.  Your back pain does not get better 2 hours after you exercise. If you have any of these problems, stop doing the exercises. Do not do them again unless your doctor says it is okay. Get help right away if:  You have sudden, very bad back pain. If this happens, stop doing the exercises. Do not do them again unless your doctor says it is okay. This information is not intended to replace advice given to you by your health care provider. Make sure you discuss any  questions you have with your health care provider. Document Released: 01/03/2011 Document Revised: 08/26/2018 Document Reviewed: 08/26/2018 Elsevier Patient Education  2020 [employer].    IF you received an x-ray today, you will receive an invoice from [facility]. Please contact [facility] at [phone] with questions or concerns regarding your invoice.   IF you received labwork today, you will receive an invoice  from [facility]. Please contact [facility] at [phone] with questions or concerns regarding your invoice.   Our billing staff will not be able to assist you with questions regarding bills from these companies.  You will be contacted with the lab results as soon as they are available. The fastest [place] to get your results is to activate your My Chart account. Instructions are located on the last page of this paperwork. If you have not heard from [place] regarding the results in 2 weeks, please contact this office.

## 2022-11-13 ENCOUNTER — Encounter: Payer: Self-pay | Admitting: Internal Medicine

## 2024-01-20 ENCOUNTER — Ambulatory Visit (INDEPENDENT_AMBULATORY_CARE_PROVIDER_SITE_OTHER): Payer: BC Managed Care – PPO | Admitting: Mental Health

## 2024-01-20 DIAGNOSIS — F902 Attention-deficit hyperactivity disorder, combined type: Secondary | ICD-10-CM | POA: Diagnosis not present

## 2024-01-20 DIAGNOSIS — F330 Major depressive disorder, recurrent, mild: Secondary | ICD-10-CM

## 2024-01-20 NOTE — Progress Notes (Signed)
 [facility] Counselor Initial Adult Exam  Name: Gavin Henry Date: 01/20/2024 MRN: [id number] DOB: [birth year] PCP: No primary care provider on file.  Time spent: 50 minutes  Reason for Visit /Presenting Problem: he reports a history of ADHD and Aspergers Disorder.  Last therapy was 4 years ago for couples counseling with his partner. His nephew recently passed away, he was age 52. His nephew moved to this area and stayed with him.  He stated he struggles with hoarding at home; both his parents were also hoarders. Patient has 2 brothers.  His mother is 20 and father is 22, they separated when he was age 45.  He is in care with his psychiatrist -  Dr. Sebastian and rx'd Zoloft 150mg  qd, Vyvanse 40mg  every day. Recommended he return to therapy in 2 weeks.   Mental Status Exam:    Appearance:    Casual     Behavior:   Appropriate  Motor:   WNL  Speech/Language:    Clear and Coherent  Affect:   Full range   Mood:   Euthymic  Thought process:   Logical, linear, goal directed  Thought content:     WNL  Sensory/Perceptual disturbances:     none  Orientation:   x4  Attention:   Good  Concentration:   Good  Memory:   Intact  Fund of knowledge:    Consistent with age and development  Insight:     Good  Judgment:    Good  Impulse Control:   Good     Risk Assessment: Danger to Self:  No Self-injurious Behavior: No Danger to Others: No Duty to Warn:no Physical Aggression / Violence:No  Access to Firearms a concern: No  Gang Involvement:No  Patient / guardian was educated about steps to take if suicide or homicide risk level increases between visits: yes While future psychiatric events cannot be accurately predicted, the patient does not currently require acute inpatient psychiatric care and does not currently meet [facility]  involuntary commitment criteria.    Past Medical History:  Diagnosis Date   Asthma    Dyslipidemia 05/01/2016   Esophageal dysmotility 05/01/2016   Hiatal  hernia 05/01/2016   Morbid obesity (HCC) 05/01/2016    Past Surgical History:  Procedure Laterality Date   HEMORRHOID SURGERY     HERNIA REPAIR      Medications: Current Outpatient Medications  Medication Sig Dispense Refill   ADVAIR DISKUS 250-50 MCG/DOSE AEPB INHALE 1 PUFF BY MOUTH TWICE DAILY 1 each 4   albuterol  (VENTOLIN  HFA) 108 (90 BASE) MCG/ACT inhaler Inhale 1-2 puffs into the lungs every 4 (four) hours as needed for wheezing or shortness of breath. 1 Inhaler 1   diclofenac  (VOLTAREN ) 75 MG EC tablet Take 1 tablet (75 mg total) by mouth 2 (two) times daily. 30 tablet 0   methocarbamol  (ROBAXIN ) 500 MG tablet Take 1 tablet (500 mg total) by mouth 4 (four) times daily. 30 tablet 1   predniSONE  (DELTASONE ) 20 MG tablet Take 3 pills daily for 2 days, then 2 daily for 2 days, then 1 daily for 2 days, then one half daily 14 tablet 0   No current facility-administered medications for this visit.    No Known Allergies  Diagnoses:  No diagnosis found.  Plan of Care: TBD   Lonni Fischer, [facility]

## 2024-02-10 ENCOUNTER — Ambulatory Visit: Payer: BC Managed Care – PPO | Admitting: Mental Health
